# Patient Record
Sex: Female | Born: 1980 | Race: White | Hispanic: Yes | Marital: Married | Smoking: Never smoker
Health system: Southern US, Community
[De-identification: ages and names within clinical notes are randomized; demographics above are authoritative.]

## PROBLEM LIST (undated history)

## (undated) DIAGNOSIS — Z789 Other specified health status: Secondary | ICD-10-CM

## (undated) HISTORY — PX: DILATION AND CURETTAGE OF UTERUS: SHX78

---

## 2003-12-08 ENCOUNTER — Emergency Department (HOSPITAL_COMMUNITY): Admission: EM | Admit: 2003-12-08 | Discharge: 2003-12-08 | Payer: Self-pay | Admitting: Emergency Medicine

## 2004-01-18 ENCOUNTER — Ambulatory Visit: Payer: Self-pay | Admitting: Family Medicine

## 2004-01-22 ENCOUNTER — Ambulatory Visit: Payer: Self-pay | Admitting: Sports Medicine

## 2004-02-04 ENCOUNTER — Encounter (INDEPENDENT_AMBULATORY_CARE_PROVIDER_SITE_OTHER): Payer: Self-pay | Admitting: *Deleted

## 2004-02-04 LAB — CONVERTED CEMR LAB

## 2004-02-13 ENCOUNTER — Ambulatory Visit: Payer: Self-pay | Admitting: Family Medicine

## 2004-03-20 ENCOUNTER — Ambulatory Visit: Payer: Self-pay | Admitting: Sports Medicine

## 2004-03-27 ENCOUNTER — Ambulatory Visit (HOSPITAL_COMMUNITY): Admission: RE | Admit: 2004-03-27 | Discharge: 2004-03-27 | Payer: Self-pay | Admitting: Family Medicine

## 2004-04-18 ENCOUNTER — Ambulatory Visit: Payer: Self-pay | Admitting: Family Medicine

## 2004-05-29 ENCOUNTER — Ambulatory Visit: Payer: Self-pay | Admitting: Family Medicine

## 2004-06-11 ENCOUNTER — Ambulatory Visit: Payer: Self-pay | Admitting: Family Medicine

## 2004-06-28 ENCOUNTER — Ambulatory Visit: Payer: Self-pay | Admitting: Family Medicine

## 2004-07-09 ENCOUNTER — Ambulatory Visit: Payer: Self-pay | Admitting: Family Medicine

## 2004-07-15 ENCOUNTER — Ambulatory Visit: Payer: Self-pay | Admitting: Sports Medicine

## 2004-07-22 ENCOUNTER — Ambulatory Visit: Payer: Self-pay | Admitting: Sports Medicine

## 2004-07-30 ENCOUNTER — Ambulatory Visit: Payer: Self-pay | Admitting: Sports Medicine

## 2004-07-31 ENCOUNTER — Ambulatory Visit: Payer: Self-pay | Admitting: Family Medicine

## 2004-07-31 ENCOUNTER — Inpatient Hospital Stay (HOSPITAL_COMMUNITY): Admission: AD | Admit: 2004-07-31 | Discharge: 2004-08-03 | Payer: Self-pay | Admitting: Family Medicine

## 2004-09-11 ENCOUNTER — Emergency Department (HOSPITAL_COMMUNITY): Admission: EM | Admit: 2004-09-11 | Discharge: 2004-09-11 | Payer: Self-pay | Admitting: Emergency Medicine

## 2004-09-12 ENCOUNTER — Ambulatory Visit: Payer: Self-pay | Admitting: Family Medicine

## 2004-11-01 ENCOUNTER — Ambulatory Visit: Payer: Self-pay | Admitting: Family Medicine

## 2004-12-03 ENCOUNTER — Ambulatory Visit: Payer: Self-pay | Admitting: Family Medicine

## 2005-01-02 ENCOUNTER — Ambulatory Visit: Payer: Self-pay | Admitting: Sports Medicine

## 2005-01-31 ENCOUNTER — Ambulatory Visit: Payer: Self-pay | Admitting: Family Medicine

## 2005-03-26 ENCOUNTER — Ambulatory Visit: Payer: Self-pay | Admitting: Sports Medicine

## 2005-04-09 ENCOUNTER — Ambulatory Visit: Payer: Self-pay | Admitting: Family Medicine

## 2006-04-03 ENCOUNTER — Encounter (INDEPENDENT_AMBULATORY_CARE_PROVIDER_SITE_OTHER): Payer: Self-pay | Admitting: *Deleted

## 2006-10-01 ENCOUNTER — Inpatient Hospital Stay (HOSPITAL_COMMUNITY): Admission: AD | Admit: 2006-10-01 | Discharge: 2006-10-01 | Payer: Self-pay | Admitting: Obstetrics and Gynecology

## 2006-10-16 ENCOUNTER — Inpatient Hospital Stay (HOSPITAL_COMMUNITY): Admission: AD | Admit: 2006-10-16 | Discharge: 2006-10-16 | Payer: Self-pay | Admitting: Obstetrics & Gynecology

## 2006-10-18 ENCOUNTER — Ambulatory Visit (HOSPITAL_COMMUNITY): Admission: AD | Admit: 2006-10-18 | Discharge: 2006-10-18 | Payer: Self-pay | Admitting: Gynecology

## 2006-10-18 ENCOUNTER — Encounter (INDEPENDENT_AMBULATORY_CARE_PROVIDER_SITE_OTHER): Payer: Self-pay | Admitting: Gynecology

## 2006-10-18 ENCOUNTER — Ambulatory Visit: Payer: Self-pay | Admitting: Gynecology

## 2007-09-08 ENCOUNTER — Ambulatory Visit: Payer: Self-pay | Admitting: Family Medicine

## 2007-09-23 ENCOUNTER — Ambulatory Visit: Payer: Self-pay | Admitting: Internal Medicine

## 2007-09-23 ENCOUNTER — Encounter: Payer: Self-pay | Admitting: Family Medicine

## 2007-09-23 LAB — CONVERTED CEMR LAB
ALT: 14 units/L (ref 0–35)
AST: 15 units/L (ref 0–37)
Albumin: 4.8 g/dL (ref 3.5–5.2)
Alkaline Phosphatase: 48 units/L (ref 39–117)
BUN: 11 mg/dL (ref 6–23)
Basophils Absolute: 0 10*3/uL (ref 0.0–0.1)
Basophils Relative: 0 % (ref 0–1)
CO2: 23 meq/L (ref 19–32)
Calcium: 9.4 mg/dL (ref 8.4–10.5)
Chloride: 103 meq/L (ref 96–112)
Cholesterol: 174 mg/dL (ref 0–200)
Creatinine, Ser: 0.6 mg/dL (ref 0.40–1.20)
Eosinophils Absolute: 0.1 10*3/uL (ref 0.0–0.7)
Eosinophils Relative: 2 % (ref 0–5)
Glucose, Bld: 91 mg/dL (ref 70–99)
HCT: 42.2 % (ref 36.0–46.0)
HDL: 39 mg/dL — ABNORMAL LOW (ref >39)
Hemoglobin: 14 g/dL (ref 12.0–15.0)
LDL Cholesterol: 108 mg/dL — ABNORMAL HIGH (ref 0–99)
Lymphocytes Relative: 40 % (ref 12–46)
Lymphs Abs: 2.4 10*3/uL (ref 0.7–4.0)
MCHC: 33.2 g/dL (ref 30.0–36.0)
MCV: 89.2 fL (ref 78.0–100.0)
Monocytes Absolute: 0.5 10*3/uL (ref 0.1–1.0)
Monocytes Relative: 8 % (ref 3–12)
Neutro Abs: 3 10*3/uL (ref 1.7–7.7)
Neutrophils Relative %: 50 % (ref 43–77)
Platelets: 238 10*3/uL (ref 150–400)
Potassium: 4.3 meq/L (ref 3.5–5.3)
RBC: 4.73 M/uL (ref 3.87–5.11)
RDW: 13.2 % (ref 11.5–15.5)
Sed Rate: 4 mm/hr (ref 0–22)
Sodium: 138 meq/L (ref 135–145)
TSH: 1.322 microintl units/mL (ref 0.350–4.50)
Total Bilirubin: 0.5 mg/dL (ref 0.3–1.2)
Total CHOL/HDL Ratio: 4.5
Total Protein: 7.2 g/dL (ref 6.0–8.3)
Triglycerides: 136 mg/dL (ref <150)
VLDL: 27 mg/dL (ref 0–40)
WBC: 6.1 10*3/uL (ref 4.0–10.5)

## 2007-11-11 ENCOUNTER — Ambulatory Visit: Payer: Self-pay | Admitting: Internal Medicine

## 2007-11-12 ENCOUNTER — Ambulatory Visit: Payer: Self-pay | Admitting: Internal Medicine

## 2007-11-12 ENCOUNTER — Encounter: Payer: Self-pay | Admitting: Family Medicine

## 2007-11-12 LAB — CONVERTED CEMR LAB
Chlamydia, DNA Probe: NEGATIVE
GC Probe Amp, Genital: NEGATIVE

## 2008-03-01 ENCOUNTER — Ambulatory Visit: Payer: Self-pay | Admitting: Internal Medicine

## 2008-04-03 ENCOUNTER — Ambulatory Visit: Payer: Self-pay | Admitting: Family Medicine

## 2008-04-14 ENCOUNTER — Inpatient Hospital Stay (HOSPITAL_COMMUNITY): Admission: AD | Admit: 2008-04-14 | Discharge: 2008-04-14 | Payer: Self-pay | Admitting: Obstetrics & Gynecology

## 2008-07-10 ENCOUNTER — Ambulatory Visit (HOSPITAL_COMMUNITY): Admission: RE | Admit: 2008-07-10 | Discharge: 2008-07-10 | Payer: Self-pay | Admitting: Family Medicine

## 2008-11-13 ENCOUNTER — Ambulatory Visit: Payer: Self-pay | Admitting: Family Medicine

## 2008-11-13 ENCOUNTER — Inpatient Hospital Stay (HOSPITAL_COMMUNITY): Admission: AD | Admit: 2008-11-13 | Discharge: 2008-11-15 | Payer: Self-pay | Admitting: Obstetrics & Gynecology

## 2009-11-21 ENCOUNTER — Ambulatory Visit (HOSPITAL_COMMUNITY): Admission: RE | Admit: 2009-11-21 | Discharge: 2009-11-21 | Payer: Self-pay | Admitting: Family Medicine

## 2010-03-17 ENCOUNTER — Inpatient Hospital Stay (HOSPITAL_COMMUNITY)
Admission: AD | Admit: 2010-03-17 | Discharge: 2010-03-19 | DRG: 775 | Disposition: A | Discharge status: Home / Self Care | Payer: Medicaid Other | Source: Ambulatory Visit | Attending: Family Medicine | Admitting: Family Medicine

## 2010-03-17 DIAGNOSIS — O99892 Other specified diseases and conditions complicating childbirth: Principal | ICD-10-CM | POA: Diagnosis present

## 2010-03-17 DIAGNOSIS — Z22330 Carrier of Group B streptococcus: Secondary | ICD-10-CM

## 2010-03-17 DIAGNOSIS — O9989 Other specified diseases and conditions complicating pregnancy, childbirth and the puerperium: Secondary | ICD-10-CM

## 2010-03-17 LAB — CBC
HCT: 37.8 % (ref 36.0–46.0)
Hemoglobin: 12.7 g/dL (ref 12.0–15.0)
MCH: 29.6 pg (ref 26.0–34.0)
MCHC: 33.6 g/dL (ref 30.0–36.0)
MCV: 88.1 fL (ref 78.0–100.0)
Platelets: 181 10*3/uL (ref 150–400)
RBC: 4.29 MIL/uL (ref 3.87–5.11)
RDW: 14.6 % (ref 11.5–15.5)
WBC: 9.7 10*3/uL (ref 4.0–10.5)

## 2010-03-17 LAB — RPR: RPR Ser Ql: NONREACTIVE

## 2010-04-08 IMAGING — US US OB COMP LESS 14 WK
1 series · 14 of 21 positions shown · non-contrast
Comparison: None

CLINICAL DATA: Pelvic pain.  7-week-3-day gestational age by LMP.

OBSTETRIC <14 WK ULTRASOUND
TECHNIQUE: Transabdominal ultrasound was performed for evaluation
of the gestation as well as the maternal uterus and adnexal
regions.

[Series 1: us ob comp less 14 wks · 21 acquisitions, 14 frames shown]
[im 1/21]
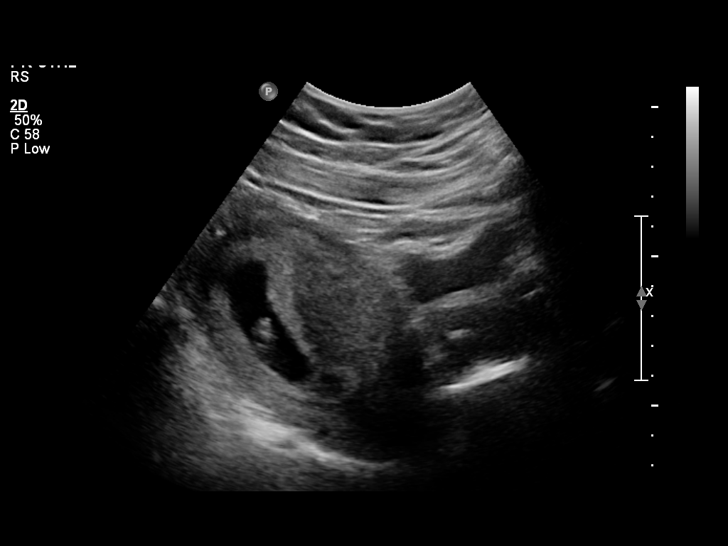
[im 3/21]
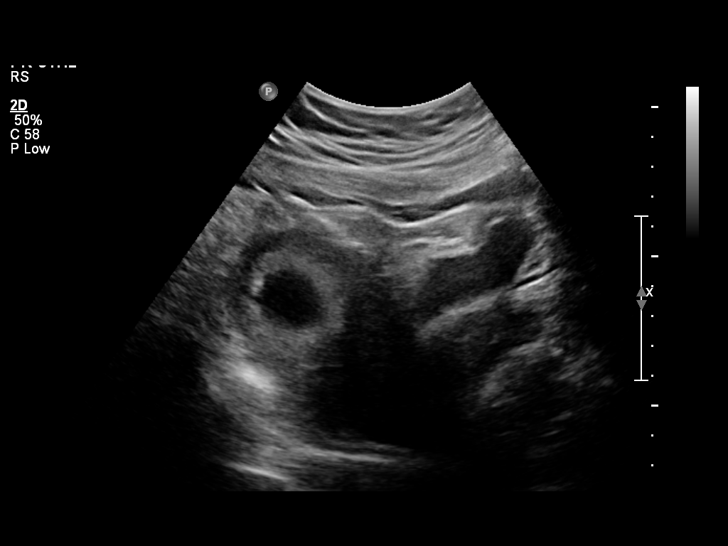
[im 4/21]
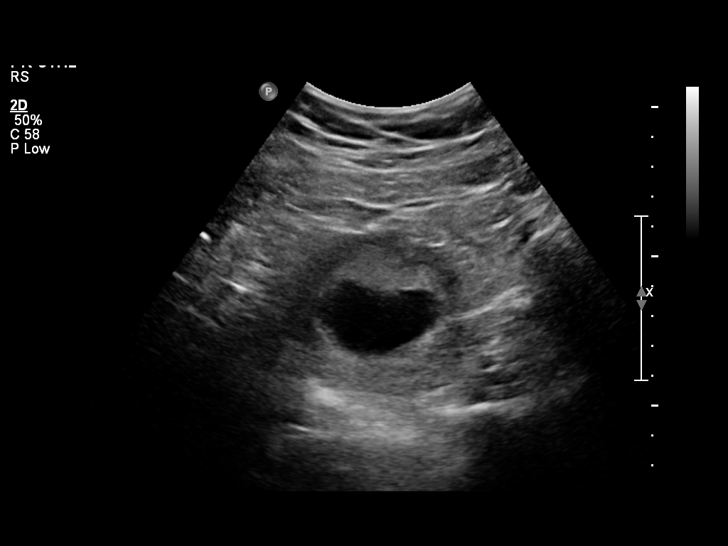
[im 6/21]
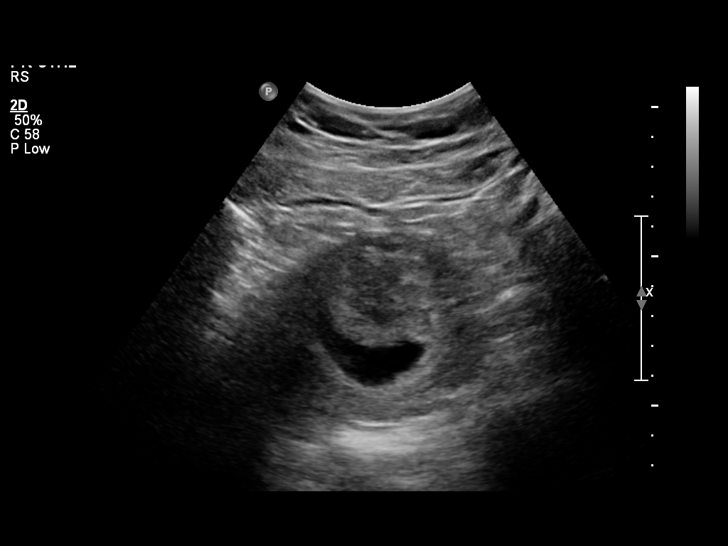
[im 7/21]
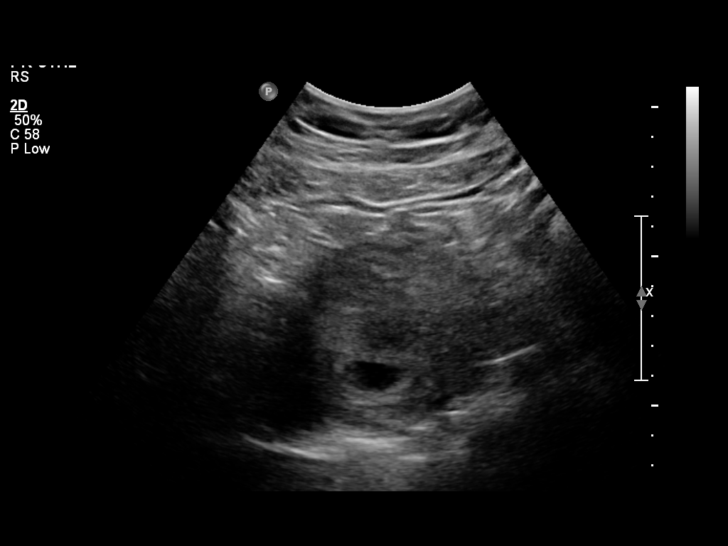
[im 9/21]
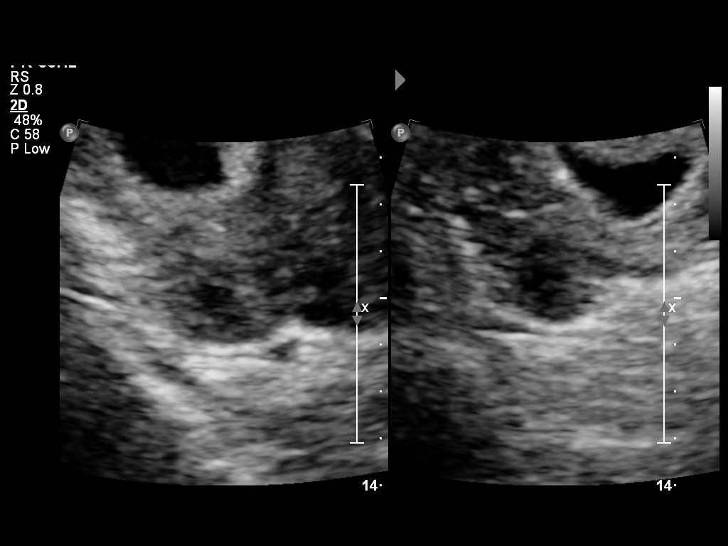
[im 10/21]
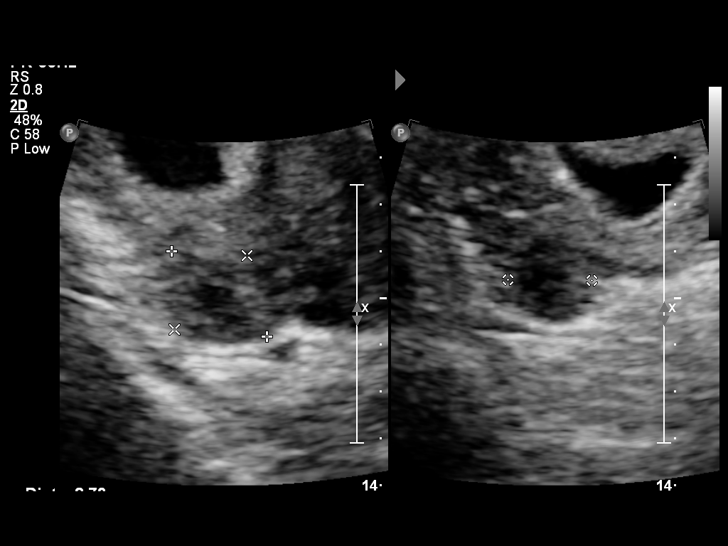
[im 12/21]
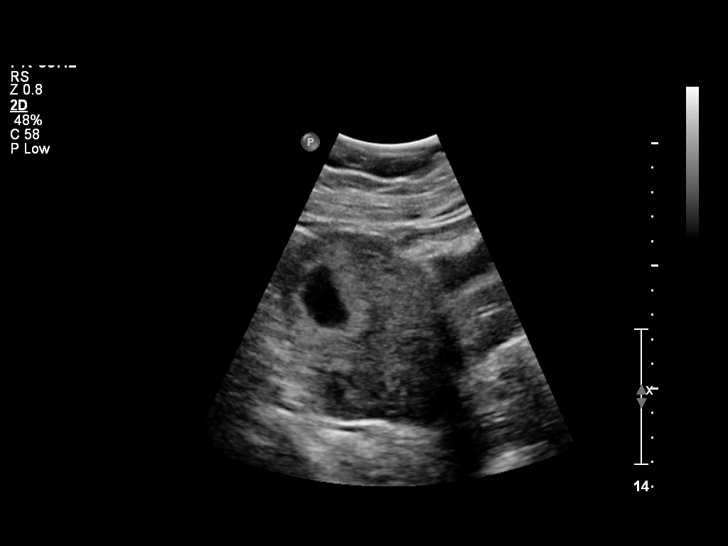
[im 13/21]
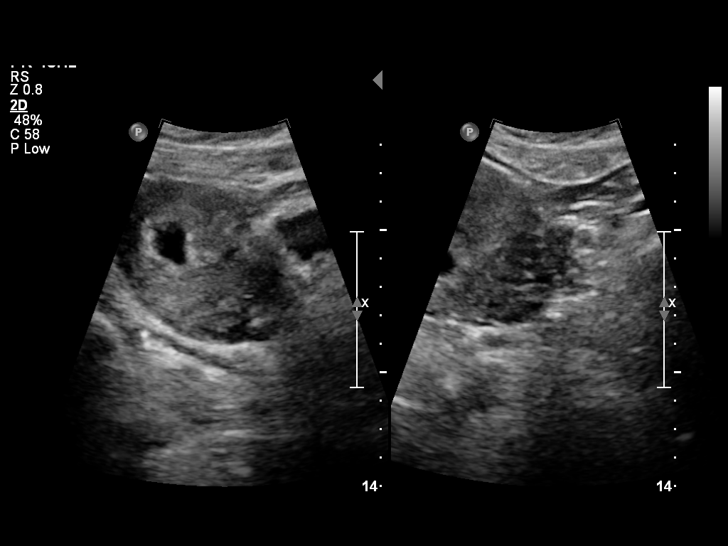
[im 15/21]
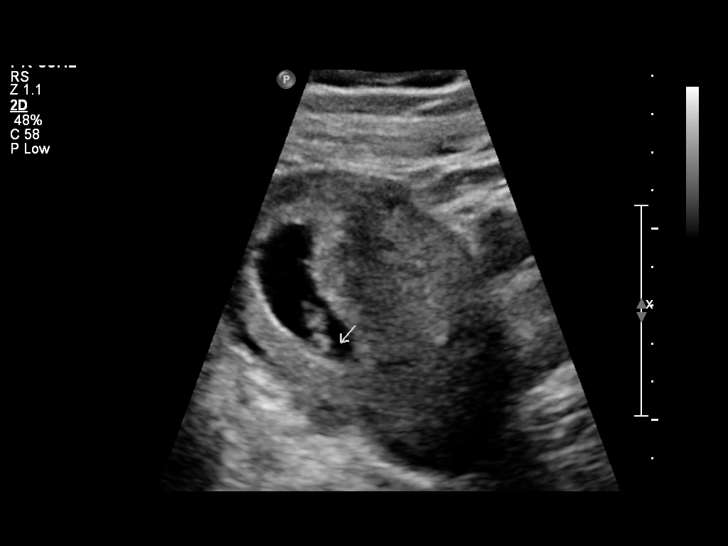
[im 16/21]
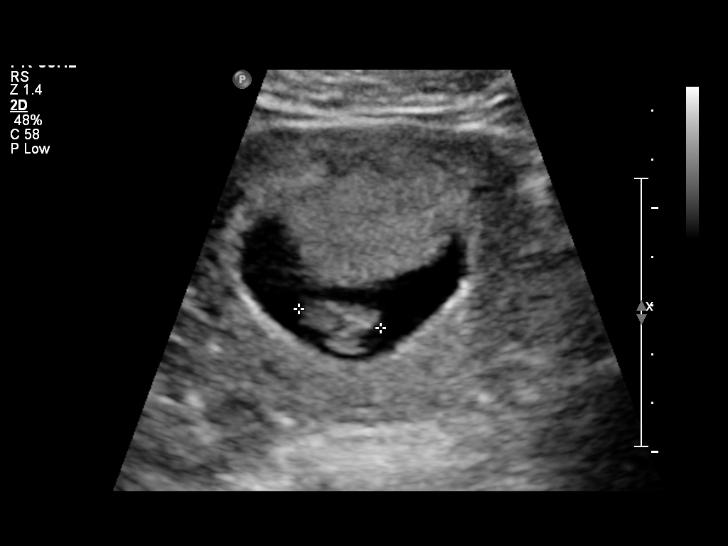
[im 18/21]
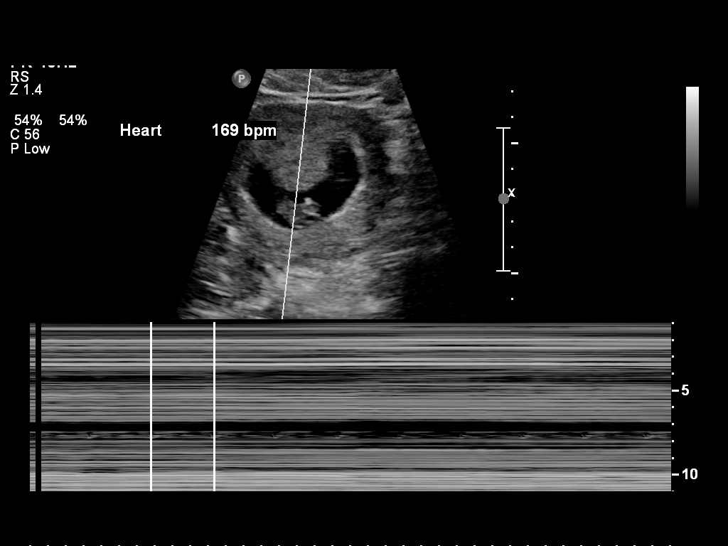
[im 19/21]
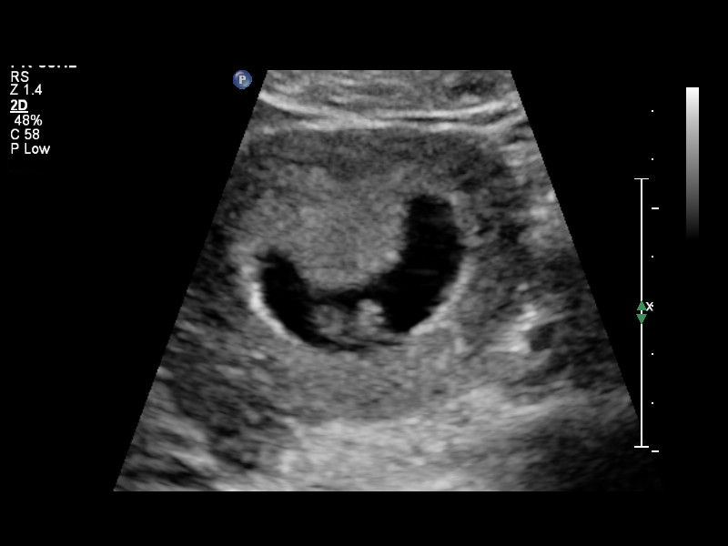
[im 21/21]
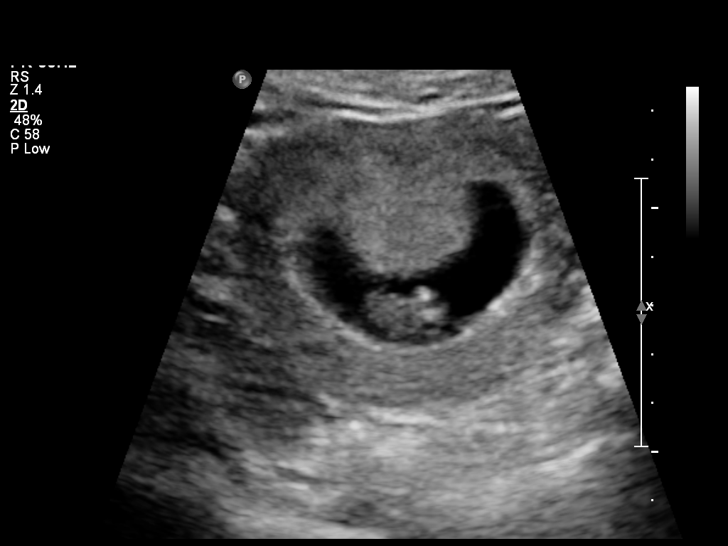

[14 of 21 positions shown; findings below may reference images not displayed]

Intrauterine gestational sac: Single
Yolk sac: Visualized
Embryo: Visualized
Cardiac Activity: Visualized
Heart Rate: 169 bpm

CRL:  17 mm         8w  2d          US EDC: 11/22/2008

Maternal uterus/Adnexae:
No abnormality identified.  Normal ovaries.
IMPRESSION: 1.  Single living IUP.  Ultrasound dating is concordant with LMP.
2.  No maternal uterine or adnexal abnormality identified.

## 2010-05-09 LAB — URINALYSIS, ROUTINE W REFLEX MICROSCOPIC
Bilirubin Urine: NEGATIVE
Glucose, UA: NEGATIVE mg/dL
Hgb urine dipstick: NEGATIVE
Ketones, ur: NEGATIVE mg/dL
Nitrite: NEGATIVE
Protein, ur: NEGATIVE mg/dL
Specific Gravity, Urine: 1.01 (ref 1.005–1.030)
Urobilinogen, UA: 0.2 mg/dL (ref 0.0–1.0)
pH: 7.5 (ref 5.0–8.0)

## 2010-05-09 LAB — COMPREHENSIVE METABOLIC PANEL WITH GFR
ALT: 18 U/L (ref 0–35)
AST: 31 U/L (ref 0–37)
Albumin: 3.3 g/dL — ABNORMAL LOW (ref 3.5–5.2)
Alkaline Phosphatase: 213 U/L — ABNORMAL HIGH (ref 39–117)
BUN: 6 mg/dL (ref 6–23)
CO2: 21 meq/L (ref 19–32)
Calcium: 9.4 mg/dL (ref 8.4–10.5)
Chloride: 104 meq/L (ref 96–112)
Creatinine, Ser: 0.5 mg/dL (ref 0.4–1.2)
GFR calc Af Amer: >60 mL/min (ref >60)
GFR calc non Af Amer: >60 mL/min (ref >60)
Glucose, Bld: 83 mg/dL (ref 70–99)
Potassium: 3.9 meq/L (ref 3.5–5.1)
Sodium: 134 meq/L — ABNORMAL LOW (ref 135–145)
Total Bilirubin: 0.4 mg/dL (ref 0.3–1.2)
Total Protein: 6.3 g/dL (ref 6.0–8.3)

## 2010-05-09 LAB — CBC
HCT: 37.5 % (ref 36.0–46.0)
Hemoglobin: 12.6 g/dL (ref 12.0–15.0)
MCHC: 33.5 g/dL (ref 30.0–36.0)
MCV: 90.3 fL (ref 78.0–100.0)
Platelets: 188 10*3/uL (ref 150–400)
RBC: 4.16 MIL/uL (ref 3.87–5.11)
RDW: 15.6 % — ABNORMAL HIGH (ref 11.5–15.5)
WBC: 10.7 10*3/uL — ABNORMAL HIGH (ref 4.0–10.5)

## 2010-05-09 LAB — RPR: RPR Ser Ql: NONREACTIVE

## 2010-05-16 LAB — URINALYSIS, ROUTINE W REFLEX MICROSCOPIC
Bilirubin Urine: NEGATIVE
Glucose, UA: NEGATIVE mg/dL
Hgb urine dipstick: NEGATIVE
Ketones, ur: NEGATIVE mg/dL
Nitrite: NEGATIVE
Protein, ur: NEGATIVE mg/dL
Specific Gravity, Urine: 1.025 (ref 1.005–1.030)
Urobilinogen, UA: 0.2 mg/dL (ref 0.0–1.0)
pH: 6 (ref 5.0–8.0)

## 2010-05-16 LAB — CBC
HCT: 38.9 % (ref 36.0–46.0)
Hemoglobin: 13.4 g/dL (ref 12.0–15.0)
MCHC: 34.4 g/dL (ref 30.0–36.0)
MCV: 92.3 fL (ref 78.0–100.0)
Platelets: 213 10*3/uL (ref 150–400)
RBC: 4.21 MIL/uL (ref 3.87–5.11)
RDW: 12.8 % (ref 11.5–15.5)
WBC: 11.9 10*3/uL — ABNORMAL HIGH (ref 4.0–10.5)

## 2010-05-16 LAB — HCG, QUANTITATIVE, PREGNANCY: hCG, Beta Chain, Quant, S: 153186 m[IU]/mL — ABNORMAL HIGH (ref <5)

## 2010-05-16 LAB — WET PREP, GENITAL
Clue Cells Wet Prep HPF POC: NONE SEEN
Trich, Wet Prep: NONE SEEN
Yeast Wet Prep HPF POC: NONE SEEN

## 2010-05-16 LAB — POCT PREGNANCY, URINE: Preg Test, Ur: POSITIVE

## 2010-05-16 LAB — GC/CHLAMYDIA PROBE AMP, GENITAL
Chlamydia, DNA Probe: NEGATIVE
GC Probe Amp, Genital: NEGATIVE

## 2010-06-18 NOTE — Op Note (Signed)
Brenda Snow, DIGUGLIELMO ACCOUNT NO.:  [id number]   MEDICAL RECORD NO.:  [id number]          PATIENT TYPE:  MAT   LOCATION:  [facility]                          FACILITY:  [facility]   PHYSICIAN:  Ginger Carne, MD  DATE OF BIRTH:  [birth year]   DATE OF PROCEDURE:  10/18/2006  DATE OF DISCHARGE:                               OPERATIVE REPORT   PREOPERATIVE DIAGNOSIS:  First trimester incomplete abortion.   POSTOPERATIVE DIAGNOSIS:  First trimester incomplete abortion.   PROCEDURE:  Aspiration, dilation and extraction.   SURGEON:  Ginger Carne, MD   ASSISTANT:  None.   COMPLICATIONS:  None immediate.   ESTIMATED BLOOD LOSS:  Minimal.   SPECIMEN:  Products of conception to Pathology.   OPERATIVE FINDINGS:  External genitalia, vulva and vagina normal.  Cervix smooth, without erosions or lesions.  Uterus was 9-10 weeks in  size, sounded to 10 cm, products of conception noted.  Both adnexa  palpable and found to be normal.  She is Rh-positive.   OPERATIVE PROCEDURE:  The patient was prepped and draped in the usual  fashion and placed in the lithotomy position, Betadine solution used for  antiseptic and the patient was catheterized prior to the procedure.  After adequate general anesthesia, a tenaculum was placed on the  anterior lip of the cervix.  Dilatation to accommodate a #10 suction  curette was then followed by sharp and then suction curettage.  No  material was left inside the endometrial cavity, minimal bleeding noted.  The patient tolerated the procedure well and returned to the post  anesthesia recovery room in excellent condition.      Ginger Carne, MD  Electronically Signed     SHB/MEDQ  D:  10/18/2006  T:  10/19/2006  Job:  [id number]

## 2010-11-15 LAB — URINALYSIS, ROUTINE W REFLEX MICROSCOPIC
Bilirubin Urine: NEGATIVE
Glucose, UA: NEGATIVE
Ketones, ur: NEGATIVE
Leukocytes, UA: NEGATIVE
Nitrite: NEGATIVE
Protein, ur: NEGATIVE
Specific Gravity, Urine: <1.005 — ABNORMAL LOW
Urobilinogen, UA: 0.2
pH: 6.5

## 2010-11-15 LAB — URINE MICROSCOPIC-ADD ON

## 2010-11-15 LAB — CBC
Hemoglobin: 13.3
MCHC: 35.1
MCV: 88.9
Platelets: 268
RBC: 4.26
RDW: 13
WBC: 10.7 — ABNORMAL HIGH
WBC: 10.9 — ABNORMAL HIGH

## 2010-11-15 LAB — POCT PREGNANCY, URINE
Operator id: 120561
Operator id: 114931
Preg Test, Ur: POSITIVE
Preg Test, Ur: POSITIVE

## 2010-11-15 LAB — RPR: RPR Ser Ql: NONREACTIVE

## 2010-11-15 LAB — HCG, QUANTITATIVE, PREGNANCY: hCG, Beta Chain, Quant, S: 1524 — ABNORMAL HIGH

## 2010-11-15 LAB — WET PREP, GENITAL: Trich, Wet Prep: NONE SEEN

## 2010-11-15 LAB — ABO/RH: ABO/RH(D): A POS

## 2010-11-15 LAB — GC/CHLAMYDIA PROBE AMP, GENITAL: GC Probe Amp, Genital: NEGATIVE

## 2011-06-03 ENCOUNTER — Encounter: Payer: Self-pay | Admitting: Family Medicine

## 2011-06-03 ENCOUNTER — Ambulatory Visit: Payer: Self-pay | Admitting: Family Medicine

## 2011-06-03 DIAGNOSIS — R112 Nausea with vomiting, unspecified: Secondary | ICD-10-CM

## 2011-06-03 DIAGNOSIS — R1084 Generalized abdominal pain: Secondary | ICD-10-CM

## 2011-06-03 DIAGNOSIS — R5381 Other malaise: Secondary | ICD-10-CM

## 2011-06-03 DIAGNOSIS — R002 Palpitations: Secondary | ICD-10-CM

## 2011-06-03 DIAGNOSIS — R110 Nausea: Secondary | ICD-10-CM

## 2011-06-03 DIAGNOSIS — R5383 Other fatigue: Secondary | ICD-10-CM

## 2011-06-03 LAB — COMPREHENSIVE METABOLIC PANEL
ALT: 14 U/L (ref 0–35)
AST: 13 U/L (ref 0–37)
Albumin: 5.2 g/dL (ref 3.5–5.2)
Alkaline Phosphatase: 46 U/L (ref 39–117)
BUN: 8 mg/dL (ref 6–23)
CO2: 26 mEq/L (ref 19–32)
Calcium: 10.5 mg/dL (ref 8.4–10.5)
Chloride: 104 mEq/L (ref 96–112)
Creat: 0.62 mg/dL (ref 0.50–1.10)
Glucose, Bld: 93 mg/dL (ref 70–99)
Potassium: 4 mEq/L (ref 3.5–5.3)
Sodium: 141 mEq/L (ref 135–145)
Total Bilirubin: 0.3 mg/dL (ref 0.3–1.2)
Total Protein: 7.9 g/dL (ref 6.0–8.3)

## 2011-06-03 LAB — POCT CBC
Granulocyte percent: 68.6 %G (ref 37–80)
HCT, POC: 45.4 % (ref 37.7–47.9)
Hemoglobin: 14.7 g/dL (ref 12.2–16.2)
Lymph, poc: 2.5 (ref 0.6–3.4)
MCH, POC: 29.5 pg (ref 27–31.2)
MCHC: 32.4 g/dL (ref 31.8–35.4)
MCV: 91.2 fL (ref 80–97)
MID (cbc): 0.5 (ref 0–0.9)
MPV: 11.6 fL (ref 0–99.8)
POC Granulocyte: 6.7 (ref 2–6.9)
POC LYMPH PERCENT: 25.8 %L (ref 10–50)
POC MID %: 5.6 %M (ref 0–12)
Platelet Count, POC: 272 10*3/uL (ref 142–424)
RBC: 4.98 M/uL (ref 4.04–5.48)
RDW, POC: 13.6 %
WBC: 9.7 10*3/uL (ref 4.6–10.2)

## 2011-06-03 LAB — POCT URINALYSIS DIPSTICK
Bilirubin, UA: NEGATIVE
Blood, UA: NEGATIVE
Glucose, UA: NEGATIVE
Ketones, UA: NEGATIVE
Leukocytes, UA: NEGATIVE
Nitrite, UA: NEGATIVE
Protein, UA: NEGATIVE
Spec Grav, UA: 1.02
Urobilinogen, UA: 0.2
pH, UA: 7

## 2011-06-03 LAB — POCT UA - MICROSCOPIC ONLY
Bacteria, U Microscopic: NEGATIVE
Casts, Ur, LPF, POC: NEGATIVE
Crystals, Ur, HPF, POC: NEGATIVE
Mucus, UA: NEGATIVE
RBC, urine, microscopic: NEGATIVE
WBC, Ur, HPF, POC: NEGATIVE
Yeast, UA: NEGATIVE

## 2011-06-03 LAB — T4, FREE: Free T4: 1.15 ng/dL (ref 0.80–1.80)

## 2011-06-03 LAB — POCT URINE PREGNANCY: Preg Test, Ur: NEGATIVE

## 2011-06-03 MED ORDER — PROMETHAZINE HCL 12.5 MG PO TABS: 12.5000 mg | ORAL_TABLET | Freq: Three times a day (TID) | ORAL | Status: DC | PRN

## 2011-06-03 NOTE — Progress Notes (Signed)
This is a 31 year old Hispanic woman who comes in with a friend complaining of palpitations and nausea for one week. She's not had these problems before. She doubts she is pregnant because she's using precautions. She has no cramps, no vomiting, no diarrhea, no rash, no chest pains or shortness of breath.  Objective: Patient appears a little anxious but in no acute distress. Breathing appears normal and patient is cooperative and friendly  HEENT: Unremarkable  Neck: No adenopathy or thyromegaly  Chest: Clear  Heart: Regular with 2/6 systolic murmur best heard at the right heart border radiating up to the right shoulder, no gallop  Abdomen: Normal bowel sounds, no HSM, no masses, nontender  Extremities: Moving 4 extremities equally without any apparent bony or soft tissue abnormalities  Skin: No rash  EKG:  NSR  Results for orders placed in visit on 06/03/11  POCT CBC      Component Value Range   WBC 9.7  4.6 - 10.2 (K/uL)   Lymph, poc 2.5  0.6 - 3.4    POC LYMPH PERCENT 25.8  10 - 50 (%L)   MID (cbc) 0.5  0 - 0.9    POC MID % 5.6  0 - 12 (%M)   POC Granulocyte 6.7  2 - 6.9    Granulocyte percent 68.6  37 - 80 (%G)   RBC 4.98  4.04 - 5.48 (M/uL)   Hemoglobin 14.7  12.2 - 16.2 (g/dL)   HCT, POC 57.8  46.9 - 47.9 (%)   MCV 91.2  80 - 97 (fL)   MCH, POC 29.5  27 - 31.2 (pg)   MCHC 32.4  31.8 - 35.4 (g/dL)   RDW, POC 62.9     Platelet Count, POC 272  142 - 424 (K/uL)   MPV 11.6  0 - 99.8 (fL)  POCT UA - MICROSCOPIC ONLY      Component Value Range   WBC, Ur, HPF, POC neg     RBC, urine, microscopic neg     Bacteria, U Microscopic neg     Mucus, UA neg     Epithelial cells, urine per micros 0-1     Crystals, Ur, HPF, POC neg     Casts, Ur, LPF, POC neg     Yeast, UA neg    POCT URINALYSIS DIPSTICK      Component Value Range   Color, UA light yellow     Clarity, UA clear     Glucose, UA neg     Bilirubin, UA neg     Ketones, UA neg     Spec Grav, UA 1.020     Blood,  UA neg     pH, UA 7.0     Protein, UA neg     Urobilinogen, UA 0.2     Nitrite, UA neg     Leukocytes, UA Negative     Negative pregnancy test  Assessment:   31 yo woman with recent palpitations, nausea which are bothersome but which have no significant associated signs or lab values.  Plan:  Phenergan 25 mg at hs Thyroid pending  FriendLamar Laundry:  [phone]

## 2011-06-03 NOTE — Patient Instructions (Signed)
Nuseas, Adulto (Nausea, Adult) La nusea es la sensacin de Dentist en el estmago o de la necesidad de vomitar. No constituye una preocupacin seria en s misma, pero puede ser un signo de problemas mdicos ms graves. Si empeora, puede provocar vmitos. Si aparecen vmitos, hay riesgo de deshidratacin.  CAUSES   Infecciones por virus.   Intoxicacin alimentaria.   Medicamentos.   Embarazo.   Mareos por movimiento.   Cefaleas migraosas.   Estrs emocional.   Dolor intenso producido en Corporate treasurer.   Intoxicacin por alcohol.  INSTRUCCIONES PARA EL CUIDADO EN EL HOGAR   Debe hacer reposo.   Pida instrucciones especficas a su mdico con respecto a la rehidratacin.   Consuma cantidades pequeas de alimentos y tome sorbos de lquidos con ms frecuencia.   [employer] como le indic el mdico.  SOLICITE ATENCIN MDICA SI:   No mejora, o [place], despus de 2 [address].   Tiene cefalea.  SOLICITE ATENCIN MDICA DE INMEDIATO SI:  Tiene fiebre.   Se desmaya.   Sigue vomitando u observa sangre en el vmito.   Se siente extremadamente dbil o deshidratado.   La materia fecal es negra o tiene [place].   Siente dolor intenso en el pecho o en el abdomen.  ASEGRESE DE QUE:   Comprende estas instrucciones.   Controlar su enfermedad.   Solicitar ayuda de inmediato si no mejora o si empeora.  Document Released: 01/20/2005 Document Revised: 01/09/2011 [facility] Patient Information 2012 [place], [place].

## 2011-06-04 ENCOUNTER — Telehealth: Payer: Self-pay

## 2011-06-04 NOTE — Telephone Encounter (Signed)
.  umfc The patient's friend called regarding lab results from patient's visit on 06/03/11.   Please call the PATIENT to verify request and let her know about results once they come in.  Patient's phone number is [phone]

## 2011-06-05 NOTE — Telephone Encounter (Signed)
Letter was sent yesterday

## 2011-06-05 NOTE — Telephone Encounter (Signed)
CALLED PT TO GIVE HER LAB RESULTS. SHE STATED SHE DID NOT SPEAK ENGLISH AND THEN HUNG UP ON ME.

## 2011-06-07 ENCOUNTER — Encounter: Payer: Self-pay | Admitting: Family Medicine

## 2011-06-07 ENCOUNTER — Ambulatory Visit: Payer: Self-pay | Admitting: Family Medicine

## 2011-06-07 VITALS — BP 145/82 | HR 74 | Temp 98.5°F | Resp 16 | Ht 60.5 in | Wt 140.6 lb

## 2011-06-07 DIAGNOSIS — R002 Palpitations: Secondary | ICD-10-CM

## 2011-06-07 LAB — GLUCOSE, POCT (MANUAL RESULT ENTRY): POC Glucose: 88

## 2011-06-07 MED ORDER — DILTIAZEM HCL 30 MG PO TABS: 30.0000 mg | ORAL_TABLET | Freq: Four times a day (QID) | ORAL | Status: DC

## 2011-06-07 NOTE — Progress Notes (Signed)
This is a followup visit for this mother of 3 who is been having palpitations as frequently at time of sleep. Been under stress with her children and has no other symptoms of heart disease such as shortness of breath, chest pain, diaphoresis, cough.    O:  120/80 right arm sitting Chest is clear  Heart: Regular no murmur or gallop  Spent 20 minutes trying to explain her palpitations to the patient and her friend Furniture conservator/restorer. They have elected to go ahead and get a cholesterol check.  Assessment: Palpitations, most likely related to anxiety  Plan trial of diltiazem 30 mg at at bedtime and we will check the cholesterol

## 2011-06-07 NOTE — Patient Instructions (Signed)
Palpitaciones  (Palpitations) Usted tiene palpitaciones. Es la sensacin de sentir que el latido cardaco es irregular o es ms rpido que lo normal. Aunque es algo que atemoriza, generalmente no es grave. Algunas de las causas de este trastorno son el fumar en exceso, en el consumo de cafena o de alcohol. Tambin pueden originarse por situaciones de estrs o de ansiedad. A veces la causa es una enfermedad cardaca. A menos que lo confirme de otro modo, el profesional que lo asiste no ha encontrado ahora signos de enfermedad grave. INSTRUCCIONES PARA EL CUIDADO DOMICILIARIO Para prevenir las palpitaciones:  Leamon Arnt, te y gaseosas sin cafena. Evite el chocolate.   Si fuma o bebe alcohol, deje el hbito o redzcalo lo ms que pueda.   Reduzca los niveles de estrs y [place]. La biorretroalimentacin, el yoga o la meditacin lo ayudarn a relajarse. Tambin podrn ayudarlo la natacin, trotar o caminar.  SOLICITE ATENCIN MDICA SI:  Sigue sintiendo el ritmo cardaco acelerado.   Las [employer] suceden con ms frecuencia.  SOLICITE ATENCIN MDICA DE INMEDIATO SI: Presenta dolor en el pecho, falta de aire, dolor de cabeza intenso, mareos o se desmaya. Document Released: 10/30/2004 Document Revised: 01/09/2011 [facility] Patient Information 2012 [place], [place].Palpitations  A palpitation is the feeling that your heartbeat is irregular or is faster than normal. Although this is frightening, it usually is not serious. Palpitations may be caused by excesses of smoking, caffeine, or alcohol. They are also brought on by stress and anxiety. Sometimes, they are caused by heart disease. Unless otherwise noted, your caregiver did not find any signs of serious illness at this time. HOME CARE INSTRUCTIONS  To help prevent palpitations:  Drink decaffeinated coffee, tea, and soda pop. Avoid chocolate.   If you smoke or drink alcohol, quit or cut down as much as possible.   Reduce your stress or  anxiety level. Biofeedback, yoga, or meditation will help you relax. Physical activity such as swimming, jogging, or walking also may be helpful.  SEEK MEDICAL CARE IF:   You continue to have a fast heartbeat.   Your palpitations occur more often.  SEEK IMMEDIATE MEDICAL CARE IF: You develop chest pain, shortness of breath, severe headache, dizziness, or fainting. Document Released: 01/18/2000 Document Revised: 01/09/2011 Document Reviewed: 03/19/2007 [facility] Patient Information 2012 [place], [place].

## 2011-06-08 LAB — LIPID PANEL
Cholesterol: 189 mg/dL (ref 0–200)
HDL: 50 mg/dL (ref >39)
LDL Cholesterol: 118 mg/dL — ABNORMAL HIGH (ref 0–99)
Total CHOL/HDL Ratio: 3.8 Ratio
Triglycerides: 103 mg/dL (ref <150)
VLDL: 21 mg/dL (ref 0–40)

## 2012-02-04 NOTE — L&D Delivery Note (Signed)
Delivery Note At 3:15 PM a healthy female was delivered via Vaginal, Spontaneous Delivery (Presentation: ; Occiput Anterior).  APGAR: 9, 9; weight .   Placenta status: Intact, Spontaneous.  Cord: 3 vessels with the following complications: None.    Anesthesia: None  Episiotomy: None Lacerations: 1st degree; Periurethral;Perineal Suture Repair: none Est. Blood Loss (mL): 200  Mom to postpartum.  Baby to Nursery.  Mat Carne, MD  12/13/2012, 3:35 PM   I was present for and supervised the delivery of this baby.  I agree with the documentation as above. Hemostatic periurethral tear not requiring suturing.    Fenix Ruppe, Redmond Baseman, MD

## 2012-06-03 ENCOUNTER — Other Ambulatory Visit: Payer: Self-pay

## 2012-06-03 DIAGNOSIS — Z331 Pregnant state, incidental: Secondary | ICD-10-CM

## 2012-06-03 NOTE — Progress Notes (Unsigned)
PRENATAL LABS DONE TODAY Terrall Bley 

## 2012-06-04 LAB — SICKLE CELL SCREEN: Sickle Cell Screen: NEGATIVE

## 2012-06-04 LAB — OBSTETRIC PANEL
Antibody Screen: NEGATIVE
Basophils Relative: 0 % (ref 0–1)
Eosinophils Absolute: 0.5 10*3/uL (ref 0.0–0.7)
Hemoglobin: 12.4 g/dL (ref 12.0–15.0)
Hepatitis B Surface Ag: NEGATIVE
MCH: 30 pg (ref 26.0–34.0)
MCHC: 34.3 g/dL (ref 30.0–36.0)
Monocytes Relative: 6 % (ref 3–12)
Neutrophils Relative %: 65 % (ref 43–77)
RDW: 14 % (ref 11.5–15.5)
Rh Type: POSITIVE

## 2012-06-05 LAB — CULTURE, OB URINE: Colony Count: NO GROWTH

## 2012-06-10 ENCOUNTER — Encounter: Payer: Self-pay | Admitting: Family Medicine

## 2012-06-10 ENCOUNTER — Other Ambulatory Visit (HOSPITAL_COMMUNITY)
Admission: RE | Admit: 2012-06-10 | Discharge: 2012-06-10 | Disposition: A | Discharge status: Home / Self Care | Payer: Self-pay | Source: Ambulatory Visit | Attending: Family Medicine | Admitting: Family Medicine

## 2012-06-10 ENCOUNTER — Ambulatory Visit (INDEPENDENT_AMBULATORY_CARE_PROVIDER_SITE_OTHER): Payer: Self-pay | Admitting: Family Medicine

## 2012-06-10 VITALS — BP 128/78 | Temp 98.2°F | Wt 146.0 lb

## 2012-06-10 DIAGNOSIS — Z113 Encounter for screening for infections with a predominantly sexual mode of transmission: Secondary | ICD-10-CM | POA: Insufficient documentation

## 2012-06-10 DIAGNOSIS — N898 Other specified noninflammatory disorders of vagina: Secondary | ICD-10-CM

## 2012-06-10 DIAGNOSIS — Z3490 Encounter for supervision of normal pregnancy, unspecified, unspecified trimester: Secondary | ICD-10-CM

## 2012-06-10 DIAGNOSIS — Z3480 Encounter for supervision of other normal pregnancy, unspecified trimester: Secondary | ICD-10-CM | POA: Insufficient documentation

## 2012-06-10 LAB — TSH: TSH: 0.822 u[IU]/mL (ref 0.350–4.500)

## 2012-06-10 LAB — POCT WET PREP (WET MOUNT): Clue Cells Wet Prep Whiff POC: NEGATIVE

## 2012-06-10 MED ORDER — CETIRIZINE HCL 10 MG PO TABS: 10.0000 mg | ORAL_TABLET | Freq: Every day | ORAL | Status: DC

## 2012-06-10 NOTE — Progress Notes (Addendum)
Brenda Snow is a 32 y.o. yo [id number] at [redacted]w[redacted]d (by LMP) who presents for her initial prenatal visit.  This pregnancy was not planned. Pt had been on birth control pills and had not planned to have any more children. She is sure of the date of her LMP, but her periods had been irregular prior to the LMP, sometimes coming every 15 days, sometimes coming every 2 months. OB history significant for three prior vaginal deliveries and one first trimester miscarriage requiring D&E in 2008 - op note reviewed in epic. Pt reports no chronic medical problems, except for that about one year ago she had problems with palpitations, irregular periods, and blood pressure problems. Was seen by a doctor who told her it was stress related. She says she has done research and thinks she has thyroid disease because she also had a sore throat. She reports positive home pregnancy test. She  is  taking PNV. See flow sheet for details. Pt reports that her mother just died one [place] ago, [place]. It has been a hard month for her. PHQ-9 done, score is 9 (question 9 score 0). She denies recent contractions. She did have some about one month ago when she found out her mother died but non since then. No vaginal bleeding. She has had some yellow light vaginal discharge for about two months that soaks her underwear. No gush of fluid.  PMH, POBH, FH, meds, allergies and Social Hx reviewed.  Prenatal exam:Gen: Well nourished, well developed.  No distress.  Vitals noted. HEENT: Normocephalic, atraumatic.  Neck supple without cervical lymphadenopathy, thyromegaly or thyroid nodules. CV: RRR. Soft 1-2/6 systolic murmur present. Lungs: CTA B.  Normal respiratory effort without wheezes or rales. Abd: soft, NTND. Uterus not appreciated above pelvis. GU: Normal external female genitalia without lesions.  Nl vaginal, well rugated without lesions. Cervix shows ectropion. White vaginal discharge present in the vault. No  pooling.  Bimanual exam: No adnexal mass or TTP. No CMT.  Uterus size not palpable. Ext: varicose vein present in left medial thigh. No erythema or warmth upon palpation. Psych: Normal grooming and dress.  Appropriately tearful when discussing her mother's recent death. Normal thought content and process without flight of ideas or looseness of associations  Assessment/plan: 1) Pregnancy [redacted]w[redacted]d by LMP doing well.  Current pregnancy issues include seasonal allergies and varicose vein pain. -Seasonal allergies - will rx zyrtec. -Varicose vein pain - recommend tylenol and heating pad as needed. Patient is concerned about having thyroid disease - will check TSH today. Dating is not reliable (irregular periods, on birth control) - will order u/s for dating. Pap, gc/chlamydia, and wet prep done today. Pt has dentist she sees already. Prenatal labs reviewed, unremarkable. Bleeding and pain precautions reviewed. Continue prenatal vitamins. Genetic screening offered but patient declined. Early glucola is indicated due history of a first degree relative with diabetes (pt's mother). Order placed, patient will schedule within the next week.   Follow up 4 weeks.

## 2012-06-10 NOTE — Patient Instructions (Addendum)
It was nice to meet you today!  -We will contact you to schedule an ultrasound to help figure out your due date. -Come back in one month to see me for a pregnancy checkup. -I sent in a prescription for zyrtec to your pharmacy to help with allergies. -For the varicose vein, you can try a heating pad or tylenol. Do not take ibuprofen. -If you have any vaginal bleeding, fluid leaking, or cramping, go to the M[facility] Ad[facility]'s Unit ([facility] at [facility]. -We are checking your thyroid today. You'll need to come back within the next week to do a glucose test. Schedule this lab visit on the way out.  Be well, Dr. Marianne Sofia contactaremos para darle Neomia Dear cita para su Ultrasonido para saber su fecha de alumbramiento. -Regrese en un mes para su cita prenatal. -Le he prescrito Zyrtec para su alergia. -Para su vena tome tylenol no tome Ibuprofen y pongase compresas calientes. -Si usted tiene sangrado vaginal, fluido, o calambres vaya a admisiones en [employer] [facility] [place]. -Le haremos estudio de su Tiroides hoy. Ud. nesecita regresar en Elisabeth Most para su examen de Glucosa le daran la cita hoy.  [facility] bien, Dr. Octavio Manns

## 2012-06-15 ENCOUNTER — Encounter: Payer: Self-pay | Admitting: Family Medicine

## 2012-06-15 ENCOUNTER — Ambulatory Visit (HOSPITAL_COMMUNITY): Payer: Self-pay

## 2012-06-17 ENCOUNTER — Ambulatory Visit (INDEPENDENT_AMBULATORY_CARE_PROVIDER_SITE_OTHER): Payer: Self-pay | Admitting: Family Medicine

## 2012-06-17 VITALS — BP 115/65 | Temp 98.1°F | Wt 145.9 lb

## 2012-06-17 DIAGNOSIS — J309 Allergic rhinitis, unspecified: Secondary | ICD-10-CM

## 2012-06-17 DIAGNOSIS — J302 Other seasonal allergic rhinitis: Secondary | ICD-10-CM | POA: Insufficient documentation

## 2012-06-17 MED ORDER — FLUTICASONE PROPIONATE 50 MCG/ACT NA SUSP: 2.0000 | Freq: Every day | NASAL | Status: DC

## 2012-06-17 NOTE — Patient Instructions (Addendum)
Trate el espray nasal. 2 esprays cada lado una vez diario.    Regrese a la clinica si las sintomas no estan mejorando en 1 semana.

## 2012-06-17 NOTE — Progress Notes (Signed)
[id number] @ 10.6 wks who presents for SDA with severe allergies for the past 2 weeks especially worse over the past month. She has been taking cetirizine for a month with no relief.  She has had allergies in the past but not as severe.  ROS: denies fevers, cough, sore throat, ear pain; endorses watering and itching eyes and nose; sometimes she feels like she has to wheeze Her husband has similar symptoms; no one seems sick, he seems to have allergies.   PE:  GEN: appears uncomfortable HEENT:   Head: [place]/AT; no sinus tenderness   Eyes: bilateral erythematous conjunctiva with clear tearing; no pus   Nose: no rhinorrhea, normal turbinates   Mouth: MMM PULM: NI WOB; CTAB without w/r/r CV: RRR SKIN: no rash

## 2012-06-17 NOTE — Assessment & Plan Note (Signed)
She presents with 2 weeks of significant allergic rhinitis symptoms.  -Stop generic cetrizine. She was wondering if she could dry husband's Zyrtec. She may to see if it helps.  -Flonase -May take a few days for symptoms to improve; follow-up in 1 week if no improvement or sooner if signs/symptoms infection.

## 2012-06-18 ENCOUNTER — Other Ambulatory Visit (INDEPENDENT_AMBULATORY_CARE_PROVIDER_SITE_OTHER): Payer: Self-pay

## 2012-06-18 ENCOUNTER — Encounter: Payer: Self-pay | Admitting: Family Medicine

## 2012-06-18 ENCOUNTER — Other Ambulatory Visit: Payer: Self-pay | Admitting: Family Medicine

## 2012-06-18 DIAGNOSIS — Z3490 Encounter for supervision of normal pregnancy, unspecified, unspecified trimester: Secondary | ICD-10-CM

## 2012-06-18 DIAGNOSIS — Z331 Pregnant state, incidental: Secondary | ICD-10-CM

## 2012-06-18 NOTE — Progress Notes (Signed)
1 hr OB glucose done on 50 gm glucola and by capillary blood 1 hr glucose = 136 mg/dL   3 hr GTT scheduled for Tues 06-22-12   BAJORDAN, MLS (ASCP)cm

## 2012-06-18 NOTE — Progress Notes (Signed)
1 HR GTT DONE TODAY Brenda Snow 

## 2012-06-22 ENCOUNTER — Other Ambulatory Visit (INDEPENDENT_AMBULATORY_CARE_PROVIDER_SITE_OTHER): Payer: Self-pay

## 2012-06-22 DIAGNOSIS — Z331 Pregnant state, incidental: Secondary | ICD-10-CM

## 2012-06-22 NOTE — Progress Notes (Signed)
3 HR GTT DONE TODAY Gary Gabrielsen 

## 2012-06-23 LAB — GLUCOSE TOLERANCE, 3 HOURS: Glucose, GTT - 3 Hour: 95 mg/dL (ref 70–144)

## 2012-06-25 ENCOUNTER — Other Ambulatory Visit: Payer: Self-pay | Admitting: Family Medicine

## 2012-06-25 DIAGNOSIS — Z3490 Encounter for supervision of normal pregnancy, unspecified, unspecified trimester: Secondary | ICD-10-CM

## 2012-06-29 ENCOUNTER — Encounter: Payer: Self-pay | Admitting: Family Medicine

## 2012-06-30 ENCOUNTER — Other Ambulatory Visit: Payer: Self-pay | Admitting: Family Medicine

## 2012-06-30 ENCOUNTER — Encounter: Payer: Self-pay | Admitting: Family Medicine

## 2012-06-30 ENCOUNTER — Ambulatory Visit (HOSPITAL_COMMUNITY)
Admission: RE | Admit: 2012-06-30 | Discharge: 2012-06-30 | Disposition: A | Discharge status: Home / Self Care | Payer: No Typology Code available for payment source | Source: Ambulatory Visit | Attending: Family Medicine | Admitting: Family Medicine

## 2012-06-30 DIAGNOSIS — Z3689 Encounter for other specified antenatal screening: Secondary | ICD-10-CM | POA: Insufficient documentation

## 2012-06-30 DIAGNOSIS — Z3490 Encounter for supervision of normal pregnancy, unspecified, unspecified trimester: Secondary | ICD-10-CM

## 2012-07-15 ENCOUNTER — Encounter: Payer: Self-pay | Admitting: Family Medicine

## 2012-07-18 ENCOUNTER — Encounter: Payer: Self-pay | Admitting: Family Medicine

## 2012-07-19 ENCOUNTER — Ambulatory Visit (INDEPENDENT_AMBULATORY_CARE_PROVIDER_SITE_OTHER): Payer: No Typology Code available for payment source | Admitting: Family Medicine

## 2012-07-19 ENCOUNTER — Other Ambulatory Visit (HOSPITAL_COMMUNITY)
Admission: RE | Admit: 2012-07-19 | Discharge: 2012-07-19 | Disposition: A | Discharge status: Home / Self Care | Payer: No Typology Code available for payment source | Source: Ambulatory Visit | Attending: Family Medicine | Admitting: Family Medicine

## 2012-07-19 VITALS — BP 124/67 | Wt 152.0 lb

## 2012-07-19 DIAGNOSIS — Z113 Encounter for screening for infections with a predominantly sexual mode of transmission: Secondary | ICD-10-CM | POA: Insufficient documentation

## 2012-07-19 DIAGNOSIS — N898 Other specified noninflammatory disorders of vagina: Secondary | ICD-10-CM

## 2012-07-19 DIAGNOSIS — Z3480 Encounter for supervision of other normal pregnancy, unspecified trimester: Secondary | ICD-10-CM

## 2012-07-19 DIAGNOSIS — Z3482 Encounter for supervision of other normal pregnancy, second trimester: Secondary | ICD-10-CM

## 2012-07-19 DIAGNOSIS — O9989 Other specified diseases and conditions complicating pregnancy, childbirth and the puerperium: Secondary | ICD-10-CM

## 2012-07-19 LAB — POCT WET PREP (WET MOUNT)

## 2012-07-19 MED ORDER — PRENATAL VITAMINS 0.8 MG PO TABS: 1.0000 | ORAL_TABLET | Freq: Every day | ORAL | Status: AC

## 2012-07-19 NOTE — Patient Instructions (Addendum)
I will call you if your test results are not normal.   Schedule a follow up visit in 4 weeks with the OB clinic to see Dr. Mauricio Po. Go to [facility] hospital if you have any bleeding, fluid leaking, cramping/contractions, or feel like baby is not moving well.  Embarazo - Segundo trimestre (Pregnancy - Second Trimester) El segundo trimestre del Psychiatrist (del 3 al 6 mes) es un perodo de evolucin rpida para usted y el beb. Hacia el final del sexto mes, el beb mide aproximadamente 23 cm y pesa 680 g. Comenzar a Pharmacologist del beb [place] 18 y las 20 [address] de [place]. Podr sentir las pataditas ("quickening en ingls"). Hay un rpido [employer]. Puede segregar un lquido claro Charity fundraiser) de las [place]. Quizs sienta pequeas contracciones en el vientre (tero) Esto se conoce como falso trabajo de parto o contracciones de Braxton-H[place]. Es como una prctica del trabajo de parto que se produce cuando el beb est listo para salir. Generalmente los problemas de vmitos matinales ya se han superado hacia el final del Medical sales representative. Algunas mujeres desarrollan pequeas manchas oscuras (que se denominan cloasma, mscara del embarazo) en la cara que normalmente se van luego del nacimiento del beb. La exposicin al sol empeora las manchas. Puede desarrollarse acn en algunas mujeres embarazadas, y puede desaparecer en aquellas que ya tienen acn. EXAMENES PRENATALES  Durante los [employer], deber seguir realizando pruebas de [place], segn avance el [place]. Estas pruebas se realizan para controlar su salud y la del beb. Tambin se realizan anlisis de sangre para [employer] niveles de [place]. La anemia (bajo nivel de hemoglobina) es frecuente durante el embarazo. Para prevenirla, se administran hierro y vitaminas. Tambin se le realizarn exmenes para saber si tiene diabetes entre las 24 y las 28 semanas del [place]. Podrn repetirle algunas de las [employer] hicieron  previamente.  En cada visita le medirn el tamao del tero. Esto se realiza para asegurarse de que el beb est creciendo correctamente de acuerdo al estado del [place].  Tambin en cada visita prenatal controlarn su presin arterial. Esto se realiza para asegurarse de que no tenga toxemia.  Se controlar su orina para asegurarse de que no tenga infecciones, diabetes o protena en la orina.  Se controlar su peso regularmente para asegurarse que el aumento ocurre al ritmo indicado. Esto se hace para asegurarse que usted y el beb tienen una evolucin normal.  En algunas ocasiones se realiza una prueba de ultrasonido para confirmar el correcto desarrollo y evolucin del beb. Esta prueba se realiza con ondas sonoras inofensivas para el beb, de modo que el profesional pueda calcular ms precisamente la fecha del [place]. Algunas veces se realizan pruebas especializadas del lquido amnitico que rodea al beb. Esta prueba se denomina amniocentesis. El lquido amnitico se obtiene introduciendo una aguja en el vientre (abdomen). Se realiza para Conservator, museum/gallery en los que existe alguna preocupacin acerca de algn problema gentico que pueda sufrir el beb. En ocasiones se lleva a cabo cerca del final del embarazo, si es necesario inducir al [employer]. En este caso se realiza para asegurarse que los pulmones del beb estn lo suficientemente maduros como para que pueda vivir fuera del tero. CAMBIOS QUE OCURREN EN EL SEGUNDO TRIMESTRE DEL EMBARAZO Su organismo atravesar numerosos cambios durante el [employer]. Estos pueden variar de Neomia Dear persona a otra. [place] con el profesional que la asiste acerca los cambios que usted note y que la preocupen.  Durante el segundo trimestre probablemente sienta un aumento del apetito. Es normal tener "antojos" de Development worker, community. Esto vara de Neomia Dear persona a otra y de un embarazo a Therapist, art.  El abdomen inferior comenzar a abultarse.  Podr tener  la necesidad de Geographical information systems officer con ms frecuencia debido a que el tero y el beb presionan sobre la vejiga. Tambin es frecuente contraer ms infecciones urinarias durante el [employer]. Puede evitarlas bebiendo gran cantidad de lquidos y vaciando la vejiga antes y despus de Sales promotion account executive.  Podrn aparecer las primeras estras en las caderas, abdomen y [place]. Estos son cambios normales del cuerpo durante el [place]. No existen medicamentos ni ejercicios que puedan prevenir [employer].  Es posible que comience a desarrollar venas inflamadas y abultadas (varices) en las piernas. El uso de medias de descanso, Optometrist sus pies durante 15 minutos, 3 a 4 veces al da y Film/video editor la sal en su dieta ayuda a Journalist, newspaper.  Podr sentir Engineering geologist gstrica a medida que el tero crece y Doctor, general practice. Puede tomar anticidos, con la autorizacin de su mdico, para Financial planner. Tambin es til ingerir pequeas comidas 4 a 5 veces al Futures trader.  La constipacin puede tratarse con un laxante o agregando fibra a su dieta. Beber grandes cantidades de lquidos, comer vegetales, frutas y granos integrales es de [place].  Tambin es beneficioso practicar actividad fsica. Si ha sido una persona Engineer, mining, podr continuar con la [employer] de las actividades durante el mismo. Si ha sido [employer], puede ser beneficioso que comience con un programa de ejercicios, Museum/gallery exhibitions officer.  Puede desarrollar hemorroides hacia el final del segundo trimestre. Tomar baos de asiento tibios y Chemical engineer cremas recomendadas por el profesional que lo asiste sern de ayuda para los problemas de hemorroides.  Tambin podr Financial risk analyst de espalda durante este momento de su embarazo. Evite levantar objetos pesados, utilice zapatos de taco bajo y [place] buena postura para ayudar a reducir los problemas de [place].  Algunas mujeres embarazadas desarrollan hormigueo y adormecimiento  de la mano y los dedos debido a la hinchazn y compresin de los ligamentos de la mueca (sndrome del tnel carpiano). Esto desaparece una vez que el beb nace.  Como sus pechos se agrandan, Pension scheme manager un sujetador ms grande. Use un sostn de soporte, cmodo y de algodn. No utilice un sostn para amamantar hasta el ltimo mes de embarazo si va a amamantar al beb.  Podr observar una lnea oscura desde el ombligo hacia la zona pbica denominada linea nigra.  Podr observar que sus mejillas se ponen coloradas debido al aumento de flujo sanguneo en la cara.  Podr desarrollar "araitas" en la cara, cuello y pecho. Esto desaparece una vez que el beb nace. INSTRUCCIONES PARA EL CUIDADO DOMICILIARIO  Es extremadamente importante que evite el cigarrillo, hierbas medicinales, alcohol y las drogas no prescriptas durante el Psychiatrist. Estas sustancias qumicas afectan la formacin y el desarrollo del beb. Evite estas sustancias durante todo el embarazo para asegurar el nacimiento de un beb sano.  La mayor parte de los cuidados que se aconsejan son los mismos que los indicados para Financial risk analyst trimestre del Psychiatrist. Cumpla con las citas tal como se le indic. Siga las instrucciones del profesional que lo asiste con respecto al uso de los medicamentos, el ejercicio y Psychologist, forensic.  Durante el embarazo debe obtener nutrientes para usted y para su beb. Consuma alimentos balanceados a intervalos regulares. Elija alimentos como carne,  pescado, [place] y otros productos lcteos descremados, vegetales, frutas, panes integrales y cereales. El Equities trader cul es el aumento de peso ideal.  Las relaciones sexuales fsicas pueden continuarse hasta cerca del fin del embarazo si no existen otros problemas. Estos problemas pueden ser la prdida temprana (prematura) de lquido amnitico de las [place], sangrado vaginal, dolor abdominal u otros problemas mdicos o del Psychiatrist.  Realice [employer], si no tiene restricciones. Consulte con el profesional que la asiste si no sabe con certeza si determinados ejercicios son seguros. El mayor aumento de peso tiene Environmental consultant durante los ltimos 2 trimestres del Psychiatrist. El ejercicio la ayudar a:  Engineering geologist.  Ponerla en forma para el parto.  Ayudarla a perder peso luego de haber dado a luz.  Use un buen sostn o como los que se usan para hacer deportes para Paramedic la sensibilidad de las [place]. Tambin puede serle til si lo [place] mientras duerme. Si pierde Product manager, podr [employer].  No utilice la baera con agua caliente, baos turcos y saunas durante el [address]  Utilice el cinturn de seguridad sin excepcin cuando conduzca. Este la proteger a usted y al beb en caso de accidente.  Evite comer carne cruda, queso crudo, y el contacto con los utensilios y desperdicios de los gatos. Estos elementos contienen grmenes que pueden causar defectos de nacimiento en el beb.  El segundo trimestre es un buen momento para visitar a su dentista y Software engineer si an no lo ha hecho. Es Primary school teacher los dientes limpios. Utilice un cepillo de dientes blando. Cepllese ms suavemente durante el embarazo.  Es ms fcil perder algo de orina durante el [place]. Apretar y Chief Operating Officer los msculos de la pelvis la ayudar con este problema. Practique detener la miccin cuando est en el bao. Estos son los mismos msculos que Development worker, international aid. Son [employer] mismos msculos que utiliza cuando trata de [employer]der [employer] gases. Puede practicar apretando estos msculos 10 veces, y repetir esto 3 veces por da aproximadamente. Una vez que conozca qu msculos debe apretar, no realice estos ejercicios durante la miccin. Puede favorecerle una infeccin si la orina vuelve hacia atrs.  Pida ayuda si tiene necesidades econmicas, de asesoramiento o nutricionales durante el [place]. El profesional podr ayudarla con  respecto a estas necesidades, o derivarla a otros especialistas.  La piel puede ponerse grasa. Si esto sucede, lvese la cara con un jabn [place], utilice un humectante no graso y [place] con base de aceite o crema. CONSUMO DE MEDICAMENTOS Y DROGAS DURANTE EL EMBARAZO  Contine tomando las vitaminas apropiadas para esta etapa tal como se le indic. Las vitaminas deben contener un miligramo de cido flico y deben suplementarse con hierro. Guarde todas las vitaminas fuera del alcance de los nios. La ingestin de slo un par de vitaminas o tabletas que contengan hierro puede ocasionar la [employer] en un beb o en un nio pequeo.  Evite el uso de [place], inclusive los de venta libre y hierbas que no hayan sido prescriptos o indicados por el profesional que la asiste. Algunos medicamentos pueden causar problemas fsicos al beb. Utilice los medicamentos de venta libre o de prescripcin para Chief Technology Officer, Environmental health practitioner o la [place], segn se lo indique el profesional que lo asiste. No utilice aspirina.  El consumo de alcohol est relacionado con ciertos defectos de nacimiento. Esto incluye el sndrome de alcoholismo fetal. Debe evitar el consumo de alcohol en cualquiera de  sus formas. El cigarrillo causa nacimientos prematuros y bebs de [place]. El uso de drogas recreativas est absolutamente prohibido. Son muy nocivas para el beb. Un beb que nace de [employer], ser adicto al nacer. Ese beb tendr los mismos sntomas de abstinencia que un adulto.  Infrmele al profesional si consume alguna droga.  No consuma drogas ilegales. Pueden causarle mucho dao al beb. SOLICITE ATENCIN MDICA SI: Tiene preguntas o preocupaciones durante su embarazo. Es mejor que llame para Science writer las dudas que esperar hasta su prxima visita prenatal. Thressa Sheller forma se sentir ms tranquila.  SOLICITE ATENCIN MDICA DE INMEDIATO SI:  La temperatura oral se eleva sin motivo por encima de 102 F (38.9 C) o segn  le indique el profesional que lo asiste.  Tiene una prdida de lquido por la vagina (canal de parto). Si sospecha una ruptura de las [place], tmese la temperatura y llame al profesional para informarlo sobre esto.  Observa unas pequeas manchas, una hemorragia vaginal o elimina cogulos. Notifique al profesional acerca de la cantidad y de cuntos apsitos est utilizando. Unas pequeas manchas de sangre son algo comn durante el Psychiatrist, especialmente despus de Sales promotion account executive.  Presenta un olor desagradable en la secrecin vaginal y observa un cambio en el color, de transparente a blanco.  Contina con las nuseas y no obtiene alivio de los remedios indicados. Vomita sangre o algo similar a la borra del caf.  [place] o sube ms de 900 g. en una semana, o segn lo indicado por el profesional que la asiste.  Observa que se le [employer], las manos, los pies o las piernas.  Ha estado expuesta a la rubola y no ha sufrido la enfermedad.  Ha estado expuesta a la quinta enfermedad o a la varicela.  Presenta dolor abdominal. Las molestias en el ligamento redondo son Neomia Dear causa no cancerosa (benigna) frecuente de dolor abdominal durante el embarazo. El profesional que la asiste deber evaluarla.  Presenta dolor de cabeza intenso que no se [place].  Presenta fiebre, diarrea, dolor al orinar o le falta la respiracin.  Presenta dificultad para ver, visin borrosa, o visin doble.  Sufre una cada, un accidente de trnsito o cualquier tipo de trauma.  Vive en un hogar en el que existe violencia fsica o mental. Document Released: 10/30/2004 Document Revised: 10/15/2011 [facility] Patient Information 2014 [place], [place].   Evaluacin de los movimientos fetales  (Fetal Movement Counts) Nombre del paciente: __________________________________________________ Brenda Snow estimada: ____________________ Caroleen Hamman de los movimientos fetales es muy recomendable en los  embarazos de alto riesgo, pero tambin es una buena idea que lo hagan todas las [place]. El Firefighter que comience a contarlos a las 28 semanas de [place]. Los movimientos fetales suelen aumentar:   Despus de Animator.  Despus de la actividad fsica.  Despus de comer o beber [employer] o fro.  En reposo. Preste atencin cuando sienta que el beb est ms activo. Esto le ayudar a notar un patrn de ciclos de vigilia y sueo de su beb y cules son los factores que contribuyen a un aumento de los movimientos fetales. Es importante llevar a cabo un recuento de movimientos fetales, al mismo tiempo cada da, cuando el beb normalmente est ms activo.  CMO CONTAR LOS MOVIMIENTOS FETALES 1. Busque un lugar tranquilo y cmodo para sentarse o recostarse sobre el lado izquierdo. Al recostarse sobre su lado izquierdo, le proporciona una mejor circulacin de [place] y oxgeno al  beb. 2. Anote el da y la hora en una hoja de papel o en un diario. 3. Comience contando las pataditas, revoloteos, chasquidos, vueltas o pinchazos en un perodo de 2 horas. Debe sentir al menos 10 movimientos en 2 horas. 4. Si no siente 10 movimientos en 2 horas, espere 2  3 horas y cuente de nuevo. Busque cambios en el patrn o si no cuenta lo suficiente en 2 horas. SOLICITE ATENCIN MDICA SI:   Siente menos de 10 pataditas en 2 horas, en dos intentos.  No hay movimientos durante una hora.  El patrn se modifica o le lleva ms tiempo Art gallery manager las 10 pataditas.  Siente que el beb no se mueve como lo hace habitualmente. Fecha: ____________ Movimientos: ____________ Stevan Born inicio: ____________ Stevan Born finalizacin: ____________  Franco Nones: ____________ Movimientos: ____________ Stevan Born inicio: ____________ Stevan Born finalizacin: ____________  Franco Nones: ____________ Movimientos: ____________ Stevan Born inicio: ____________ Stevan Born finalizacin: ____________  Franco Nones: ____________ Movimientos:  ____________ Stevan Born inicio: ____________ Stevan Born finalizacin: ____________  Franco Nones: ____________ Movimientos: ____________ Stevan Born inicio: ____________ Stevan Born finalizacin: ____________  Franco Nones: ____________ Movimientos: ____________ Mammie Russian de inicio: ____________ Mammie Russian de finalizacin: ____________  Franco Nones: ____________ Movimientos: ____________ Mammie Russian de inicio: ____________ Mammie Russian de finalizacin: ____________  Franco Nones: ____________ Movimientos: ____________ Mammie Russian de inicio: ____________ Mammie Russian de finalizacin: ____________  Franco Nones: ____________ Movimientos: ____________ Mammie Russian de inicio: ____________ Mammie Russian de finalizacin: ____________  Franco Nones: ____________ Movimientos: ____________ Mammie Russian de inicio: ____________ Mammie Russian de finalizacin: ____________  Franco Nones: ____________ Movimientos: ____________ Mammie Russian de inicio: ____________ Mammie Russian de finalizacin: ____________  Franco Nones: ____________ Movimientos: ____________ Mammie Russian de inicio: ____________ Mammie Russian de finalizacin: ____________  Franco Nones: ____________ Movimientos: ____________ Mammie Russian de inicio: ____________ Mammie Russian de finalizacin: ____________  Franco Nones: ____________ Movimientos: ____________ Mammie Russian de inicio: ____________ Mammie Russian de finalizacin: ____________  Franco Nones: ____________ Movimientos: ____________ Mammie Russian de inicio: ____________ Mammie Russian de finalizacin: ____________  Franco Nones: ____________ Movimientos: ____________ Mammie Russian de inicio: ____________ Mammie Russian de finalizacin: ____________  Franco Nones: ____________ Movimientos: ____________ Mammie Russian de inicio: ____________ Mammie Russian de finalizacin: ____________  Franco Nones: ____________ Movimientos: ____________ Mammie Russian de inicio: ____________ Mammie Russian de finalizacin: ____________  Franco Nones: ____________ Movimientos: ____________ Mammie Russian de inicio: ____________ Mammie Russian de finalizacin: ____________  Franco Nones: ____________ Movimientos: ____________ Mammie Russian de inicio: ____________ Mammie Russian de finalizacin: ____________  Franco Nones: ____________ Movimientos: ____________ Mammie Russian de inicio: ____________  Mammie Russian de finalizacin: ____________  Franco Nones: ____________ Movimientos: ____________ Mammie Russian de inicio: ____________ Mammie Russian de finalizacin: ____________  Franco Nones: ____________ Movimientos: ____________ Mammie Russian de inicio: ____________ Mammie Russian de finalizacin: ____________  Franco Nones: ____________ Movimientos: ____________ Mammie Russian de inicio: ____________ Mammie Russian de finalizacin: ____________  Franco Nones: ____________ Movimientos: ____________ Mammie Russian de inicio: ____________ Mammie Russian de finalizacin: ____________  Franco Nones: ____________ Movimientos: ____________ Mammie Russian de inicio: ____________ Mammie Russian de finalizacin: ____________  Franco Nones: ____________ Movimientos: ____________ Mammie Russian de inicio: ____________ Mammie Russian de finalizacin: ____________  Franco Nones: ____________ Movimientos: ____________ Mammie Russian de inicio: ____________ Mammie Russian de finalizacin: ____________  Franco Nones: ____________ Movimientos: ____________ Mammie Russian de inicio: ____________ Mammie Russian de finalizacin: ____________  Franco Nones: ____________ Movimientos: ____________ Mammie Russian de inicio: ____________ Mammie Russian de finalizacin: ____________  Franco Nones: ____________ Movimientos: ____________ Mammie Russian de inicio: ____________ Mammie Russian de finalizacin: ____________  Franco Nones: ____________ Movimientos: ____________ Mammie Russian de inicio: ____________ Mammie Russian de finalizacin: ____________  Franco Nones: ____________ Movimientos: ____________ Mammie Russian de inicio: ____________ Mammie Russian de finalizacin: ____________  Franco Nones: ____________ Movimientos: ____________ Mammie Russian de inicio: ____________ Mammie Russian de finalizacin: ____________  Franco Nones: ____________ Movimientos: ____________ Mammie Russian de inicio: ____________ Mammie Russian de finalizacin: ____________  Franco Nones: ____________ Movimientos: ____________ Mammie Russian de inicio: ____________ Mammie Russian de finalizacin: ____________  Franco Nones: ____________ Movimientos: ____________  Hora de inicio: ____________ Mammie Russian de finalizacin: ____________  Franco Nones: ____________ Movimientos: ____________ Stevan Born inicio: ____________ Stevan Born finalizacin: ____________  Franco Nones:  ____________ Movimientos: ____________ Stevan Born inicio: ____________ Stevan Born finalizacin: ____________  Franco Nones: ____________ Movimientos: ____________ Stevan Born inicio: ____________ Stevan Born finalizacin: ____________  Franco Nones: ____________ Movimientos: ____________ Stevan Born inicio: ____________ Stevan Born finalizacin: ____________  Franco Nones: ____________ Movimientos: ____________ Stevan Born inicio: ____________ Mammie Russian de finalizacin: ____________  Franco Nones: ____________ Movimientos: ____________ Mammie Russian de inicio: ____________ Mammie Russian de finalizacin: ____________  Franco Nones: ____________ Movimientos: ____________ Mammie Russian de inicio: ____________ Mammie Russian de finalizacin: ____________  Franco Nones: ____________ Movimientos: ____________ Mammie Russian de inicio: ____________ Mammie Russian de finalizacin: ____________  Franco Nones: ____________ Movimientos: ____________ Mammie Russian de inicio: ____________ Mammie Russian de finalizacin: ____________  Franco Nones: ____________ Movimientos: ____________ Mammie Russian de inicio: ____________ Mammie Russian de finalizacin: ____________  Franco Nones: ____________ Movimientos: ____________ Mammie Russian de inicio: ____________ Mammie Russian de finalizacin: ____________  Franco Nones: ____________ Movimientos: ____________ Mammie Russian de inicio: ____________ Mammie Russian de finalizacin: ____________  Franco Nones: ____________ Movimientos: ____________ Mammie Russian de inicio: ____________ Mammie Russian de finalizacin: ____________  Franco Nones: ____________ Movimientos: ____________ Mammie Russian de inicio: ____________ Mammie Russian de finalizacin: ____________  Franco Nones: ____________ Movimientos: ____________ Mammie Russian de inicio: ____________ Mammie Russian de finalizacin: ____________  Franco Nones: ____________ Movimientos: ____________ Mammie Russian de inicio: ____________ Mammie Russian de finalizacin: ____________  Franco Nones: ____________ Movimientos: ____________ Stevan Born inicio: ____________ Stevan Born finalizacin: ____________  Franco Nones: ____________ Movimientos: ____________ Stevan Born inicio: ____________ Stevan Born finalizacin: ____________  Franco Nones: ____________ Movimientos: ____________ Stevan Born inicio: ____________ Mammie Russian de finalizacin: ____________  Document Released: 04/29/2007 Document Revised: 01/07/2012 ExitCare Patient Information 2014 [place].

## 2012-07-19 NOTE — Progress Notes (Signed)
Brenda Snow is a 32 y.o. [id number] who at [redacted]w[redacted]d who presents for routine follow up.    She denies any cramping or pain. Is feeling baby move well. No blood or fluid leaking. She has had a lot of yellow discharge, which is mucous-like. No odor to the discharge. No new sexual partners. Otherwise has no other concerns today.  Plans to breastfeed her child. Pediatrician will be Dr. Gwendolyn Grant here at the [facility]. Plans to use pills for contraception after baby is born. Does not plan to have baby circumcised if the baby is a boy.  Gen: NAD Lungs: NWOB Abd: soft, nontender to palpation Ext: no appreciable lower extremity edema bilaterally Neuro: grossly nonfocal, speech intact GU: normal appearing external genitalia. Cervical ectropion present. clearish white cervical discharge present in vagina. No odor noted. No purulent drainage. No pooling of fluid. No fluid expressed from cervix with cough.  A/P: Pregnancy at [redacted]w[redacted]d.  Doing well.   Appropriate weight gain thus far. Pregnancy issues include discharge. Wet prep done in office (read by myself) and does not show any clue cells, yeast, or trichomonas. Likely leukorrhea of pregnancy. Gc/chlamydia swabs obtained as well. Anatomy ultrasound ordered. Bleeding and pain precautions reviewed. Kick counts reviewed. Follow up 4 weeks in [facility] faculty clinic.

## 2012-07-22 ENCOUNTER — Ambulatory Visit (HOSPITAL_COMMUNITY)
Admission: RE | Admit: 2012-07-22 | Discharge: 2012-07-22 | Disposition: A | Discharge status: Home / Self Care | Payer: No Typology Code available for payment source | Source: Ambulatory Visit | Attending: Family Medicine | Admitting: Family Medicine

## 2012-07-22 DIAGNOSIS — Z363 Encounter for antenatal screening for malformations: Secondary | ICD-10-CM | POA: Insufficient documentation

## 2012-07-22 DIAGNOSIS — Z3482 Encounter for supervision of other normal pregnancy, second trimester: Secondary | ICD-10-CM

## 2012-07-22 DIAGNOSIS — Z1389 Encounter for screening for other disorder: Secondary | ICD-10-CM | POA: Insufficient documentation

## 2012-07-22 DIAGNOSIS — O358XX0 Maternal care for other (suspected) fetal abnormality and damage, not applicable or unspecified: Secondary | ICD-10-CM | POA: Insufficient documentation

## 2012-07-24 ENCOUNTER — Encounter: Payer: Self-pay | Admitting: Family Medicine

## 2012-08-12 ENCOUNTER — Ambulatory Visit (INDEPENDENT_AMBULATORY_CARE_PROVIDER_SITE_OTHER): Payer: No Typology Code available for payment source | Admitting: Family Medicine

## 2012-08-12 ENCOUNTER — Encounter: Payer: Self-pay | Admitting: Family Medicine

## 2012-08-12 VITALS — BP 124/70 | Temp 98.9°F | Wt 158.4 lb

## 2012-08-12 DIAGNOSIS — O289 Unspecified abnormal findings on antenatal screening of mother: Secondary | ICD-10-CM

## 2012-08-12 DIAGNOSIS — O283 Abnormal ultrasonic finding on antenatal screening of mother: Secondary | ICD-10-CM | POA: Insufficient documentation

## 2012-08-12 DIAGNOSIS — Z3482 Encounter for supervision of other normal pregnancy, second trimester: Secondary | ICD-10-CM

## 2012-08-12 DIAGNOSIS — Z3480 Encounter for supervision of other normal pregnancy, unspecified trimester: Secondary | ICD-10-CM

## 2012-08-12 NOTE — Assessment & Plan Note (Signed)
See Vitals and Notes. JB 

## 2012-08-12 NOTE — Progress Notes (Signed)
Patient seen in [facility], visit conducted in [place].  Patient is G5P3 @[redacted]w[redacted]d .  She offers complaint of intermittent sensation of palpitations; had them earlier this morning.  Is concerned that she might have thyroid disease.  She has been checked at an outside practice, and I see her TSH was recently checked in May of this year and was normal.  She denies chest pain, denies nausea/diaphoresis/dyspnea associated with the palpitations.  She does admit to feeling some stress when considering the fact that she will have 4 children at home when the baby comes. Feels active daily FM, no vaginal bleeding or discharge, no LOF.  We discussed the results of her most recent Anatomy Scan [place], which recommends repeat US for visualization of cardiac anatomy, which was incomplete on June 19th [place].  Neither patient nor husband have family history of aneuploidy. Discussed results of 1hGTT and 3hGTT, plans for repeat 1hGTT when patient is at EGA 26-28 weeks.  Plan for repeat US; patient plans to breastfeed her son.  OB History template updated today.  For follow up in 4 weeks at 26 weeks, at which time patient will be due for repeat 1hGTT.  She should have Tdap, CBC/HIV/RPR done at 28 weeks, as well as the PMH Risk Screening Tool and the PHQ9 depression screen.  Paula Compton, MD

## 2012-08-12 NOTE — Patient Instructions (Addendum)
Fue un Research officer, trade union.  Yetta Barre' con 41 semanas y 2 semanas de Psychiatrist.   Por favor tome cuenta de las ocasiones en que siente los palpitos, y si hay alguna hora del dia o situacion que los provoca.    Estamos marcando el ultrasonido de [employer] de la [place].  QUiero que vuelva a ver a la Dra. McIntyre en 4 semanas; en esa visita haremos otra extraccion de [place], con la prueba de la glucosa otra de una hora (no necesita estar en ayunas).  SCHEDULE FOLLOW UP OB VISIT IN 4 WEEKS WITH DR MCINTYRE.  REPEAT OB US AT [facility] BEFORE NEXT VISIT. WILL NEED 1HRGTT, CBC/HIV/RPR AT NEXT VISIT.

## 2012-08-13 ENCOUNTER — Ambulatory Visit (HOSPITAL_COMMUNITY)
Admission: RE | Admit: 2012-08-13 | Discharge: 2012-08-13 | Disposition: A | Discharge status: Home / Self Care | Payer: No Typology Code available for payment source | Source: Ambulatory Visit | Attending: Family Medicine | Admitting: Family Medicine

## 2012-08-13 DIAGNOSIS — Z3689 Encounter for other specified antenatal screening: Secondary | ICD-10-CM | POA: Insufficient documentation

## 2012-08-13 DIAGNOSIS — O283 Abnormal ultrasonic finding on antenatal screening of mother: Secondary | ICD-10-CM

## 2012-08-17 ENCOUNTER — Encounter: Payer: No Typology Code available for payment source | Admitting: Family Medicine

## 2012-08-17 ENCOUNTER — Ambulatory Visit (INDEPENDENT_AMBULATORY_CARE_PROVIDER_SITE_OTHER): Payer: No Typology Code available for payment source | Admitting: Family Medicine

## 2012-08-17 VITALS — BP 120/75 | Wt 158.8 lb

## 2012-08-17 DIAGNOSIS — Z3480 Encounter for supervision of other normal pregnancy, unspecified trimester: Secondary | ICD-10-CM

## 2012-08-17 DIAGNOSIS — Z3482 Encounter for supervision of other normal pregnancy, second trimester: Secondary | ICD-10-CM

## 2012-08-17 NOTE — Patient Instructions (Addendum)
It was great to see you again today!  I will call you if we need to get another ultrasound. Everything on the last one looked good but I am going to call the ultrasound specialists to be sure.  The bumps on your labia are just swollen veins. They may get worse, and are just part of pregnancy. Come back if you have any further pain or swelling. Go to [facility] if you have any contractions/cramping, vaginal bleeding, fluid leaking, or aren't feeling baby move well.  Be well, Dr. Emerson Monte - [place] trimestre (Pregnancy - Second Trimester) El segundo trimestre del embarazo (del 3 al 6 mes) es un perodo de evolucin rpida para usted y el beb. Hacia el final del sexto mes, el beb mide aproximadamente 23 cm y pesa 680 g. Comenzar a Pharmacologist del beb [place] 18 y las 20 [address] de [place]. Podr sentir las pataditas ("quickening en ingls"). Hay un rpido [employer]. Puede segregar un lquido claro Charity fundraiser) de las [place]. Quizs sienta pequeas contracciones en el vientre (tero) Esto se conoce como falso trabajo de parto o contracciones de Braxton-H[place]. Es como una prctica del trabajo de parto que se produce cuando el beb est listo para salir. Generalmente los problemas de vmitos matinales ya se han superado hacia el final del Medical sales representative. Algunas mujeres desarrollan pequeas manchas oscuras (que se denominan cloasma, mscara del embarazo) en la cara que normalmente se van luego del nacimiento del beb. La exposicin al sol empeora las manchas. Puede desarrollarse acn en algunas mujeres embarazadas, y puede desaparecer en aquellas que ya tienen acn. EXAMENES PRENATALES  Durante los [employer], deber seguir realizando pruebas de [place], segn avance el [place]. Estas pruebas se realizan para controlar su salud y la del beb. Tambin se realizan anlisis de sangre para [employer] niveles de [place]. La anemia (bajo nivel de hemoglobina)  es frecuente durante el embarazo. Para prevenirla, se administran hierro y vitaminas. Tambin se le realizarn exmenes para saber si tiene diabetes entre las 24 y las 28 semanas del [place]. Podrn repetirle algunas de las [employer] Enterprises hicieron previamente.  En cada visita le medirn el tamao del tero. Esto se realiza para asegurarse de que el beb est creciendo correctamente de acuerdo al estado del [place].  Tambin en cada visita prenatal controlarn su presin arterial. Esto se realiza para asegurarse de que no tenga toxemia.  Se controlar su orina para asegurarse de que no tenga infecciones, diabetes o protena en la orina.  Se controlar su peso regularmente para asegurarse que el aumento ocurre al ritmo indicado. Esto se hace para asegurarse que usted y el beb tienen una evolucin normal.  En algunas ocasiones se realiza una prueba de ultrasonido para confirmar el correcto desarrollo y evolucin del beb. Esta prueba se realiza con ondas sonoras inofensivas para el beb, de modo que el profesional pueda calcular ms precisamente la fecha del [place]. Algunas veces se realizan pruebas especializadas del lquido amnitico que rodea al beb. Esta prueba se denomina amniocentesis. El lquido amnitico se obtiene introduciendo una aguja en el vientre (abdomen). Se realiza para Conservator, museum/gallery en los que existe alguna preocupacin acerca de algn problema gentico que pueda sufrir el beb. En ocasiones se lleva a cabo cerca del final del embarazo, si es necesario inducir al [employer]. En este caso se realiza para asegurarse que los pulmones del beb estn lo suficientemente maduros como para que pueda vivir fuera  del tero. CAMBIOS QUE OCURREN EN EL SEGUNDO TRIMESTRE DEL EMBARAZO Su organismo atravesar numerosos cambios durante el [employer]. Estos pueden variar de Neomia Dear persona a otra. Converse con el profesional que la asiste acerca los cambios que usted note y que la  preocupen.  Durante el segundo trimestre probablemente sienta un aumento del apetito. Es normal tener "antojos" de Development worker, community. Esto vara de Neomia Dear persona a otra y de un embarazo a Therapist, art.  El abdomen inferior comenzar a abultarse.  Podr tener la necesidad de Geographical information systems officer con ms frecuencia debido a que el tero y el beb presionan sobre la vejiga. Tambin es frecuente contraer ms infecciones urinarias durante el [employer]. Puede evitarlas bebiendo gran cantidad de lquidos y vaciando la vejiga antes y despus de Sales promotion account executive.  Podrn aparecer las primeras estras en las caderas, abdomen y [place]. Estos son cambios normales del cuerpo durante el [place]. No existen medicamentos ni ejercicios que puedan prevenir [employer].  Es posible que comience a desarrollar venas inflamadas y abultadas (varices) en las piernas. El uso de medias de descanso, Optometrist sus pies durante 15 minutos, 3 a 4 veces al da y Film/video editor la sal en su dieta ayuda a Journalist, newspaper.  Podr sentir Engineering geologist gstrica a medida que el tero crece y Doctor, general practice. Puede tomar anticidos, con la autorizacin de su mdico, para Financial planner. Tambin es til ingerir pequeas comidas 4 a 5 veces al Futures trader.  La constipacin puede tratarse con un laxante o agregando fibra a su dieta. Beber grandes cantidades de lquidos, comer vegetales, frutas y granos integrales es de [place].  Tambin es beneficioso practicar actividad fsica. Si ha sido una persona Engineer, mining, podr continuar con la Harley[employer]son de las actividades durante el mismo. Si ha sido [employer], puede ser beneficioso que comience con un programa de ejercicios, Museum/gallery exhibitions officer.  Puede desarrollar hemorroides hacia el final del segundo trimestre. Tomar baos de asiento tibios y Chemical engineer cremas recomendadas por el profesional que lo asiste sern de ayuda para los problemas de hemorroides.  Tambin podr Financial risk analyst de  espalda durante este momento de su embarazo. Evite levantar objetos pesados, utilice zapatos de taco bajo y [place] buena postura para ayudar a reducir los problemas de [place].  Algunas mujeres embarazadas desarrollan hormigueo y adormecimiento de la mano y los dedos debido a la hinchazn y compresin de los ligamentos de la mueca (sndrome del tnel carpiano). Esto desaparece una vez que el beb nace.  Como sus pechos se agrandan, Pension scheme manager un sujetador ms grande. Use un sostn de soporte, cmodo y de algodn. No utilice un sostn para amamantar hasta el ltimo mes de embarazo si va a amamantar al beb.  Podr observar una lnea oscura desde el ombligo hacia la zona pbica denominada linea nigra.  Podr observar que sus mejillas se ponen coloradas debido al aumento de flujo sanguneo en la cara.  Podr desarrollar "araitas" en la cara, cuello y pecho. Esto desaparece una vez que el beb nace. INSTRUCCIONES PARA EL CUIDADO DOMICILIARIO  Es extremadamente importante que evite el cigarrillo, hierbas medicinales, alcohol y las drogas no prescriptas durante el Psychiatrist. Estas sustancias qumicas afectan la formacin y el desarrollo del beb. Evite estas sustancias durante todo el embarazo para asegurar el nacimiento de un beb sano.  La mayor parte de los cuidados que se aconsejan son los mismos que los indicados para Financial risk analyst trimestre del Psychiatrist. Cumpla con las citas tal como se  le indic. Siga las instrucciones del profesional que lo asiste con respecto al uso de los medicamentos, el ejercicio y Psychologist, forensic.  Durante el embarazo debe obtener nutrientes para usted y para su beb. Consuma alimentos balanceados a intervalos regulares. Elija alimentos como carne, pescado, [place] y otros productos lcteos descremados, vegetales, frutas, panes integrales y cereales. El Equities trader cul es el aumento de peso ideal.  Las relaciones sexuales fsicas pueden continuarse hasta cerca del  fin del embarazo si no existen otros problemas. Estos problemas pueden ser la prdida temprana (prematura) de lquido amnitico de las [place], sangrado vaginal, dolor abdominal u otros problemas mdicos o del Psychiatrist.  Realice Tesoro Corporation, si no tiene restricciones. Consulte con el profesional que la asiste si no sabe con certeza si determinados ejercicios son seguros. El mayor aumento de peso tiene Environmental consultant durante los ltimos 2 trimestres del Psychiatrist. El ejercicio la ayudar a:  Engineering geologist.  Ponerla en forma para el parto.  Ayudarla a perder peso luego de haber dado a luz.  Use un buen sostn o como los que se usan para hacer deportes para Paramedic la sensibilidad de las [place]. Tambin puede serle til si lo [place] mientras duerme. Si pierde Product manager, podr Parker Hann[employer]in.  No utilice la baera con agua caliente, baos turcos y saunas durante el [address]  Utilice el cinturn de seguridad sin excepcin cuando conduzca. Este la proteger a usted y al beb en caso de accidente.  Evite comer carne cruda, queso crudo, y el contacto con los utensilios y desperdicios de los gatos. Estos elementos contienen grmenes que pueden causar defectos de nacimiento en el beb.  El segundo trimestre es un buen momento para visitar a su dentista y Software engineer si an no lo ha hecho. Es Primary school teacher los dientes limpios. Utilice un cepillo de dientes blando. Cepllese ms suavemente durante el embarazo.  Es ms fcil perder algo de orina durante el [place]. Apretar y Chief Operating Officer los msculos de la pelvis la ayudar con este problema. Practique detener la miccin cuando est en el bao. Estos son los mismos msculos que Development worker, international aid. Son [employer]PP[employer] Partners mismos msculos que utiliza cuando trata de [employer]der System gases. Puede practicar apretando estos msculos 10 veces, y repetir esto 3 veces por da aproximadamente. Una vez que conozca qu msculos debe  apretar, no realice estos ejercicios durante la miccin. Puede favorecerle una infeccin si la orina vuelve hacia atrs.  Pida ayuda si tiene necesidades econmicas, de asesoramiento o nutricionales durante el [place]. El profesional podr ayudarla con respecto a estas necesidades, o derivarla a otros especialistas.  La piel puede ponerse grasa. Si esto sucede, lvese la cara con un jabn Candler-McAfee, utilice un humectante no graso y [place] con base de aceite o crema. CONSUMO DE MEDICAMENTOS Y DROGAS DURANTE EL EMBARAZO  Contine tomando las vitaminas apropiadas para esta etapa tal como se le indic. Las vitaminas deben contener un miligramo de cido flico y deben suplementarse con hierro. Guarde todas las vitaminas fuera del alcance de los nios. La ingestin de slo un par de vitaminas o tabletas que contengan hierro puede ocasionar la [employer] Mining en un beb o en un nio pequeo.  Evite el uso de [place], inclusive los de venta libre y hierbas que no hayan sido prescriptos o indicados por el profesional que la asiste. Algunos medicamentos pueden causar problemas fsicos al beb. Utilice los medicamentos de venta libre o de prescripcin para Chief Technology Officer,  el malestar o la fiebre, segn se lo indique el profesional que lo asiste. No utilice aspirina.  El consumo de alcohol est relacionado con ciertos defectos de nacimiento. Esto incluye el sndrome de alcoholismo fetal. Debe evitar el consumo de alcohol en cualquiera de sus formas. El cigarrillo causa nacimientos prematuros y bebs de [place]. El uso de drogas recreativas est absolutamente prohibido. Son muy nocivas para el beb. Un beb que nace de American Express, ser adicto al nacer. Ese beb tendr los mismos sntomas de abstinencia que un adulto.  Infrmele al profesional si consume alguna droga.  No consuma drogas ilegales. Pueden causarle mucho dao al beb. SOLICITE ATENCIN MDICA SI: Tiene preguntas o preocupaciones durante su embarazo.  Es mejor que llame para Science writer las dudas que esperar hasta su prxima visita prenatal. Thressa Sheller forma se sentir ms tranquila.  SOLICITE ATENCIN MDICA DE INMEDIATO SI:  La temperatura oral se eleva sin motivo por encima de 102 F (38.9 C) o segn le indique el profesional que lo asiste.  Tiene una prdida de lquido por la vagina (canal de parto). Si sospecha una ruptura de las [place], tmese la temperatura y llame al profesional para informarlo sobre esto.  Observa unas pequeas manchas, una hemorragia vaginal o elimina cogulos. Notifique al profesional acerca de la cantidad y de cuntos apsitos est utilizando. Unas pequeas manchas de sangre son algo comn durante el Psychiatrist, especialmente despus de Sales promotion account executive.  Presenta un olor desagradable en la secrecin vaginal y observa un cambio en el color, de transparente a blanco.  Contina con las nuseas y no obtiene alivio de los remedios indicados. Vomita sangre o algo similar a la borra del caf.  [place] o sube ms de 900 g. en una semana, o segn lo indicado por el profesional que la asiste.  Observa que se le Southwest Airlines, las manos, los pies o las piernas.  Ha estado expuesta a la rubola y no ha sufrido la enfermedad.  Ha estado expuesta a la quinta enfermedad o a la varicela.  Presenta dolor abdominal. Las molestias en el ligamento redondo son Neomia Dear causa no cancerosa (benigna) frecuente de dolor abdominal durante el embarazo. El profesional que la asiste deber evaluarla.  Presenta dolor de cabeza intenso que no se [place].  Presenta fiebre, diarrea, dolor al orinar o le falta la respiracin.  Presenta dificultad para ver, visin borrosa, o visin doble.  Sufre una cada, un accidente de trnsito o cualquier tipo de trauma.  Vive en un hogar en el que existe violencia fsica o mental. Document Released: 10/30/2004 Document Revised: 10/15/2011 [facility] Patient Information 2014 [place],  [place].

## 2012-08-17 NOTE — Progress Notes (Signed)
Brenda Snow is a 32 y.o. [id number] at [redacted]w[redacted]d who presents to find out the results of her ultrasound. She has been very nervous about the results. She just saw Dr. Mauricio Po in [facility] clinic last week but wanted an appointment to find out her u/s results. States she is doing well. Denies vaginal bleeding, discharge, fluid leaking, cramping/contractions. Feels baby moving well. Vaginal bumps: has noted some bumps on the left side of her vagina. They do not hurt or itch. She is not entirely sure how long they have been there but first noticed it 3 days ago.   Exam: NAD Lungs: NWOB FHR 138 GU: external genitalia shows venous congestion of left labia. nontender to palpation, no erythema or fluctuance. No lesions or sores noted. Ext: No appreciable lower extremity edema bilaterally   A/P: 32 y.o. [id number] at [redacted]w[redacted]d, doing well. Vaginal bumps - exam consistent with venous congestion. Examined with Dr. Deirdre Priest who agrees. 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 Ultrasound: Had previous anatomy scan which did not visualize cardiac anatomy or face completely, so recommended f/u scan. On her f/u scan the heart was well visualized and appeared normal but the facial profile was not visualized due to fetal position. Explained these results to patient. Advised her that I will call the radiologist at [facility] to find out if there is any additional f/u necessary as the report did not specify. I will let her know what I find out. BP: initially elevated today - 134/73, but improved to 120/75 upon my recheck. Likely secondary to stress upon arriving at visit. F/u at future visits. F/u in 3 weeks for routine OB visit.  [name], MD

## 2012-08-19 ENCOUNTER — Telehealth: Payer: Self-pay | Admitting: Family Medicine

## 2012-08-19 NOTE — Telephone Encounter (Signed)
Called patient home phone to relay results of OB US; I left voice message in Spanish for her to call to discuss.  I called mobile number, currently carried by husband.  I gave him a reassuring general message that I was calling to give more specific results to Ms. Brenda Snow. JB

## 2012-09-10 ENCOUNTER — Other Ambulatory Visit (INDEPENDENT_AMBULATORY_CARE_PROVIDER_SITE_OTHER): Payer: No Typology Code available for payment source

## 2012-09-10 DIAGNOSIS — Z3480 Encounter for supervision of other normal pregnancy, unspecified trimester: Secondary | ICD-10-CM

## 2012-09-10 LAB — GLUCOSE, CAPILLARY
Comment 1: 1
Glucose-Capillary: 155 mg/dL — ABNORMAL HIGH (ref 70–99)

## 2012-09-10 NOTE — Progress Notes (Signed)
1 HR GTT DONE TODAY Brenda Snow 

## 2012-09-14 ENCOUNTER — Other Ambulatory Visit: Payer: No Typology Code available for payment source

## 2012-09-15 ENCOUNTER — Other Ambulatory Visit: Payer: No Typology Code available for payment source

## 2012-09-15 ENCOUNTER — Ambulatory Visit (INDEPENDENT_AMBULATORY_CARE_PROVIDER_SITE_OTHER): Payer: No Typology Code available for payment source | Admitting: Family Medicine

## 2012-09-15 VITALS — BP 122/76 | Wt 159.0 lb

## 2012-09-15 DIAGNOSIS — Z3480 Encounter for supervision of other normal pregnancy, unspecified trimester: Secondary | ICD-10-CM

## 2012-09-15 DIAGNOSIS — Z3482 Encounter for supervision of other normal pregnancy, second trimester: Secondary | ICD-10-CM

## 2012-09-15 DIAGNOSIS — Z331 Pregnant state, incidental: Secondary | ICD-10-CM

## 2012-09-15 LAB — CBC
Hemoglobin: 11 g/dL — ABNORMAL LOW (ref 12.0–15.0)
MCH: 29 pg (ref 26.0–34.0)
MCV: 86.8 fL (ref 78.0–100.0)
Platelets: 228 10*3/uL (ref 150–400)
RBC: 3.79 MIL/uL — ABNORMAL LOW (ref 3.87–5.11)
WBC: 7.8 10*3/uL (ref 4.0–10.5)

## 2012-09-15 NOTE — Patient Instructions (Signed)
Everything looks great today.  I will call you if your test results are not normal.  Otherwise, I will send you a letter.  If you do not hear from me with in 2 weeks please call our office.     Come back in 2 weeks for your next appointment.  If you have any cramping/contractions, vaginal bleeding, fluid leaking, or are worried that baby is not moving normally, go immediately to [facility] to be evaluated.  Be well, Dr. Emerson Monte - [place] trimestre (Pregnancy - Second Trimester) El segundo trimestre del embarazo (del 3 al 6 mes) es un perodo de evolucin rpida para usted y el beb. Hacia el final del sexto mes, el beb mide aproximadamente 23 cm y pesa 680 g. Comenzar a Pharmacologist del beb [place] 18 y las 20 [address] de [place]. Podr sentir las pataditas ("quickening en ingls"). Hay un rpido [employer]. Puede segregar un lquido claro Charity fundraiser) de las [place]. Quizs sienta pequeas contracciones en el vientre (tero) Esto se conoce como falso trabajo de parto o contracciones de Braxton-Hicks. Es como una prctica del trabajo de parto que se produce cuando el beb est listo para salir. Generalmente los problemas de vmitos matinales ya se han superado hacia el final del Medical sales representative. Algunas mujeres desarrollan pequeas manchas oscuras (que se denominan cloasma, mscara del embarazo) en la cara que normalmente se van luego del nacimiento del beb. La exposicin al sol empeora las manchas. Puede desarrollarse acn en algunas mujeres embarazadas, y puede desaparecer en aquellas que ya tienen acn. EXAMENES PRENATALES  Durante los [employer], deber seguir realizando pruebas de [place], segn avance el [place]. Estas pruebas se realizan para controlar su salud y la del beb. Tambin se realizan anlisis de sangre para [employer] niveles de [place]. La anemia (bajo nivel de hemoglobina) es frecuente durante el embarazo. Para prevenirla, se  administran hierro y vitaminas. Tambin se le realizarn exmenes para saber si tiene diabetes entre las 24 y las 28 semanas del [place]. Podrn repetirle algunas de las [employer] hicieron previamente.  En cada visita le medirn el tamao del tero. Esto se realiza para asegurarse de que el beb est creciendo correctamente de acuerdo al estado del [place].  Tambin en cada visita prenatal controlarn su presin arterial. Esto se realiza para asegurarse de que no tenga toxemia.  Se controlar su orina para asegurarse de que no tenga infecciones, diabetes o protena en la orina.  Se controlar su peso regularmente para asegurarse que el aumento ocurre al ritmo indicado. Esto se hace para asegurarse que usted y el beb tienen una evolucin normal.  En algunas ocasiones se realiza una prueba de ultrasonido para confirmar el correcto desarrollo y evolucin del beb. Esta prueba se realiza con ondas sonoras inofensivas para el beb, de modo que el profesional pueda calcular ms precisamente la fecha del [place]. Algunas veces se realizan pruebas especializadas del lquido amnitico que rodea al beb. Esta prueba se denomina amniocentesis. El lquido amnitico se obtiene introduciendo una aguja en el vientre (abdomen). Se realiza para Conservator, museum/gallery en los que existe alguna preocupacin acerca de algn problema gentico que pueda sufrir el beb. En ocasiones se lleva a cabo cerca del final del embarazo, si es necesario inducir al [employer]. En este caso se realiza para asegurarse que los pulmones del beb estn lo suficientemente maduros como para que pueda vivir fuera del tero. CAMBIOS QUE OCURREN EN  EL SEGUNDO TRIMESTRE DEL EMBARAZO Su organismo atravesar numerosos cambios durante el [employer]. Estos pueden variar de Neomia Dear persona a otra. [place] con el profesional que la asiste acerca los cambios que usted note y que la preocupen.  Durante el segundo trimestre probablemente  sienta un aumento del apetito. Es normal tener "antojos" de Development worker, community. Esto vara de Neomia Dear persona a otra y de un embarazo a Therapist, art.  El abdomen inferior comenzar a abultarse.  Podr tener la necesidad de Geographical information systems officer con ms frecuencia debido a que el tero y el beb presionan sobre la vejiga. Tambin es frecuente contraer ms infecciones urinarias durante el [employer]. Puede evitarlas bebiendo gran cantidad de lquidos y vaciando la vejiga antes y despus de Sales promotion account executive.  Podrn aparecer las primeras estras en las caderas, abdomen y [place]. Estos son cambios normales del cuerpo durante el [place]. No existen medicamentos ni ejercicios que puedan prevenir [employer].  Es posible que comience a desarrollar venas inflamadas y abultadas (varices) en las piernas. El uso de medias de descanso, Optometrist sus pies durante 15 minutos, 3 a 4 veces al da y Film/video editor la sal en su dieta ayuda a Journalist, newspaper.  Podr sentir Engineering geologist gstrica a medida que el tero crece y Doctor, general practice. Puede tomar anticidos, con la autorizacin de su mdico, para Financial planner. Tambin es til ingerir pequeas comidas 4 a 5 veces al Futures trader.  La constipacin puede tratarse con un laxante o agregando fibra a su dieta. Beber grandes cantidades de lquidos, comer vegetales, frutas y granos integrales es de [place].  Tambin es beneficioso practicar actividad fsica. Si ha sido una persona Engineer, mining, podr continuar con la [employer] de las actividades durante el mismo. Si ha sido [employer], puede ser beneficioso que comience con un programa de ejercicios, Museum/gallery exhibitions officer.  Puede desarrollar hemorroides hacia el final del segundo trimestre. Tomar baos de asiento tibios y Chemical engineer cremas recomendadas por el profesional que lo asiste sern de ayuda para los problemas de hemorroides.  Tambin podr Financial risk analyst de espalda durante este momento de su embarazo. Evite  levantar objetos pesados, utilice zapatos de taco bajo y [place] buena postura para ayudar a reducir los problemas de [place].  Algunas mujeres embarazadas desarrollan hormigueo y adormecimiento de la mano y los dedos debido a la hinchazn y compresin de los ligamentos de la mueca (sndrome del tnel carpiano). Esto desaparece una vez que el beb nace.  Como sus pechos se agrandan, Pension scheme manager un sujetador ms grande. Use un sostn de soporte, cmodo y de algodn. No utilice un sostn para amamantar hasta el ltimo mes de embarazo si va a amamantar al beb.  Podr observar una lnea oscura desde el ombligo hacia la zona pbica denominada linea nigra.  Podr observar que sus mejillas se ponen coloradas debido al aumento de flujo sanguneo en la cara.  Podr desarrollar "araitas" en la cara, cuello y pecho. Esto desaparece una vez que el beb nace. INSTRUCCIONES PARA EL CUIDADO DOMICILIARIO  Es extremadamente importante que evite el cigarrillo, hierbas medicinales, alcohol y las drogas no prescriptas durante el Psychiatrist. Estas sustancias qumicas afectan la formacin y el desarrollo del beb. Evite estas sustancias durante todo el embarazo para asegurar el nacimiento de un beb sano.  La mayor parte de los cuidados que se aconsejan son los mismos que los indicados para Financial risk analyst trimestre del Psychiatrist. Cumpla con las citas tal como se le indic. Siga las instrucciones del  profesional que lo asiste con respecto al uso de los medicamentos, el ejercicio y Psychologist, forensic.  Durante el embarazo debe obtener nutrientes para usted y para su beb. Consuma alimentos balanceados a intervalos regulares. Elija alimentos como carne, pescado, [place] y otros productos lcteos descremados, vegetales, frutas, panes integrales y cereales. El Equities trader cul es el aumento de peso ideal.  Las relaciones sexuales fsicas pueden continuarse hasta cerca del fin del embarazo si no existen otros problemas.  Estos problemas pueden ser la prdida temprana (prematura) de lquido amnitico de las [place], sangrado vaginal, dolor abdominal u otros problemas mdicos o del Psychiatrist.  Realice [employer], si no tiene restricciones. Consulte con el profesional que la asiste si no sabe con certeza si determinados ejercicios son seguros. El mayor aumento de peso tiene Environmental consultant durante los ltimos 2 trimestres del Psychiatrist. El ejercicio la ayudar a:  Engineering geologist.  Ponerla en forma para el parto.  Ayudarla a perder peso luego de haber dado a luz.  Use un buen sostn o como los que se usan para hacer deportes para Paramedic la sensibilidad de las [place]. Tambin puede serle til si lo [place] mientras duerme. Si pierde Product manager, podr [employer]in.  No utilice la baera con agua caliente, baos turcos y saunas durante el [address]  Utilice el cinturn de seguridad sin excepcin cuando conduzca. Este la proteger a usted y al beb en caso de accidente.  Evite comer carne cruda, queso crudo, y el contacto con los utensilios y desperdicios de los gatos. Estos elementos contienen grmenes que pueden causar defectos de nacimiento en el beb.  El segundo trimestre es un buen momento para visitar a su dentista y Software engineer si an no lo ha hecho. Es Primary school teacher los dientes limpios. Utilice un cepillo de dientes blando. Cepllese ms suavemente durante el embarazo.  Es ms fcil perder algo de orina durante el [place]. Apretar y Chief Operating Officer los msculos de la pelvis la ayudar con este problema. Practique detener la miccin cuando est en el bao. Estos son los mismos msculos que Development worker, international aid. Son [employer] mismos msculos que utiliza cuando trata de [employer] gases. Puede practicar apretando estos msculos 10 veces, y repetir esto 3 veces por da aproximadamente. Una vez que conozca qu msculos debe apretar, no realice estos ejercicios durante la  miccin. Puede favorecerle una infeccin si la orina vuelve hacia atrs.  Pida ayuda si tiene necesidades econmicas, de asesoramiento o nutricionales durante el [place]. El profesional podr ayudarla con respecto a estas necesidades, o derivarla a otros especialistas.  La piel puede ponerse grasa. Si esto sucede, lvese la cara con un jabn [place], utilice un humectante no graso y [place] con base de aceite o crema. CONSUMO DE MEDICAMENTOS Y DROGAS DURANTE EL EMBARAZO  Contine tomando las vitaminas apropiadas para esta etapa tal como se le indic. Las vitaminas deben contener un miligramo de cido flico y deben suplementarse con hierro. Guarde todas las vitaminas fuera del alcance de los nios. La ingestin de slo un par de vitaminas o tabletas que contengan hierro puede ocasionar la [employer] en un beb o en un nio pequeo.  Evite el uso de [place], inclusive los de venta libre y hierbas que no hayan sido prescriptos o indicados por el profesional que la asiste. Algunos medicamentos pueden causar problemas fsicos al beb. Utilice los medicamentos de venta libre o de prescripcin para Chief Technology Officer, el malestar o la [place], segn  se lo indique el profesional que lo asiste. No utilice aspirina.  El consumo de alcohol est relacionado con ciertos defectos de nacimiento. Esto incluye el sndrome de alcoholismo fetal. Debe evitar el consumo de alcohol en cualquiera de sus formas. El cigarrillo causa nacimientos prematuros y bebs de [place]. El uso de drogas recreativas est absolutamente prohibido. Son muy nocivas para el beb. Un beb que nace de [employer], ser adicto al nacer. Ese beb tendr los mismos sntomas de abstinencia que un adulto.  Infrmele al profesional si consume alguna droga.  No consuma drogas ilegales. Pueden causarle mucho dao al beb. SOLICITE ATENCIN MDICA SI: Tiene preguntas o preocupaciones durante su embarazo. Es mejor que llame para Science writer las dudas que  esperar hasta su prxima visita prenatal. Thressa Sheller forma se sentir ms tranquila.  SOLICITE ATENCIN MDICA DE INMEDIATO SI:  La temperatura oral se eleva sin motivo por encima de 102 F (38.9 C) o segn le indique el profesional que lo asiste.  Tiene una prdida de lquido por la vagina (canal de parto). Si sospecha una ruptura de las [place], tmese la temperatura y llame al profesional para informarlo sobre esto.  Observa unas pequeas manchas, una hemorragia vaginal o elimina cogulos. Notifique al profesional acerca de la cantidad y de cuntos apsitos est utilizando. Unas pequeas manchas de sangre son algo comn durante el Psychiatrist, especialmente despus de Sales promotion account executive.  Presenta un olor desagradable en la secrecin vaginal y observa un cambio en el color, de transparente a blanco.  Contina con las nuseas y no obtiene alivio de los remedios indicados. Vomita sangre o algo similar a la borra del caf.  [place] o sube ms de 900 g. en una semana, o segn lo indicado por el profesional que la asiste.  Observa que se le [employer], las manos, los pies o las piernas.  Ha estado expuesta a la rubola y no ha sufrido la enfermedad.  Ha estado expuesta a la quinta enfermedad o a la varicela.  Presenta dolor abdominal. Las molestias en el ligamento redondo son Neomia Dear causa no cancerosa (benigna) frecuente de dolor abdominal durante el embarazo. El profesional que la asiste deber evaluarla.  Presenta dolor de cabeza intenso que no se [place].  Presenta fiebre, diarrea, dolor al orinar o le falta la respiracin.  Presenta dificultad para ver, visin borrosa, o visin doble.  Sufre una cada, un accidente de trnsito o cualquier tipo de trauma.  Vive en un hogar en el que existe violencia fsica o mental. Document Released: 10/30/2004 Document Revised: 10/15/2011 [facility] Patient Information 2014 [place], [place].

## 2012-09-15 NOTE — Progress Notes (Signed)
Brenda Snow is a 32 y.o. [id number] at [redacted]w[redacted]d for routine follow up.  She reports good fetal movement. Denies contractions/cramping, vaginal bleeding, or fluid leaking. Does get some cramping in her legs, which she had with her previous pregnancies. Also felt tired a few days. See flow sheet for details.  A/P: Pregnancy at [redacted]w[redacted]d.  Doing well.   3 hour GTT done today (failed 1hr). 28 week labs: CBC, HIV, RPR drawn today. PHQ-9 done - score was 4, "not a problem", with question #9 (SI) being "never". Pregnancy medical home form completed - no concerns identified. Discussed ultrasound results, that fetal facial profile was not completely seen. I have spoken with the radiologist who read the ultrasound several weeks ago, and learned that this is not something that is necessarily important to follow up. Patient is okay with not continuing to follow this. Childbirth and education classes were not offered - will defer to next visit. Preterm labor precautions reviewed. Follow up 2 weeks.

## 2012-09-15 NOTE — Progress Notes (Unsigned)
3 HR GTT DONE TODAY Indya Oliveria 

## 2012-09-16 LAB — GLUCOSE TOLERANCE, 3 HOURS: Glucose Tolerance, 1 hour: 148 mg/dL (ref 70–189)

## 2012-09-29 ENCOUNTER — Ambulatory Visit (INDEPENDENT_AMBULATORY_CARE_PROVIDER_SITE_OTHER): Payer: No Typology Code available for payment source | Admitting: Family Medicine

## 2012-09-29 VITALS — BP 127/75 | Temp 98.6°F | Wt 161.0 lb

## 2012-09-29 DIAGNOSIS — Z23 Encounter for immunization: Secondary | ICD-10-CM

## 2012-09-29 DIAGNOSIS — Z3480 Encounter for supervision of other normal pregnancy, unspecified trimester: Secondary | ICD-10-CM

## 2012-09-29 DIAGNOSIS — Z3483 Encounter for supervision of other normal pregnancy, third trimester: Secondary | ICD-10-CM

## 2012-09-29 NOTE — Progress Notes (Signed)
Pt is a 32 yo [id number] at [redacted]w[redacted]d presenting for routine follow up.  Pt reports increased fatigue and palpitations. She feels it most in the mornings. Never had anything like this before with prior pregnancies. No shortness of breath, no nausea. Some pre-syncope symptoms. For a few weeks.  Good fetal movements, no bleeding, no LOF. Intermittent contractions. Drinking 4-5 bottles of water (16oz). No soda, no juice. Taking care of herself, per her report  O: See flowsheet  A/P: - Continue to drink water and INCREASE her intake. Could be mildly dehydrated causing her symptoms vs. Third trimester of pregnancy - Follow up in 3 weeks (at 32 weeks) or sooner if anything comes up. - Tdap given today - Given preterm labor precautions

## 2012-09-29 NOTE — Patient Instructions (Addendum)
It was good to see you.  Try drinking even MORE water.  Please come back in 3 weeks for a follow up appointment.  Embarazo  Systems analyst trimestre  (Pregnancy - Third Trimester) El tercer trimestre del Psychiatrist (los ltimos 3 meses) es el perodo en el cual tanto usted como su beb crecen con ms rapidez. El beb alcanza un largo de aproximadamente 50 cm. y pesa entre 2,700 y 4,500 kg. El beb gana ms tejido graso y est listo para la vida fuera del cuerpo de la [place]. Mientras estn en el interior, los bebs tienen perodos de sueo y vigilia, Warehouse manager y tienen hipo. Quizs sienta pequeas contracciones del tero. Este es el falso trabajo de [place]. Tambin se las conoce como contracciones de [place] . Es como una prctica del parto. Los problemas ms habituales de esta etapa del embarazo incluyen mayor dificultad para respirar, hinchazn de las manos y los pies por retencin de lquidos y la necesidad de Geographical information systems officer con ms frecuencia debido a que el tero y el beb presionan sobre la vejiga.  EXAMENES PRENATALES   Durante los [employer], deber seguir realizndose anlisis de [place]. Estas pruebas se realizan para controlar su salud y la del beb. Los [employer] de sangre se Radiographer, therapeutic para [employer] niveles de algunos compuestos de la sangre (hemoglobina). La anemia (bajo nivel de hemoglobina) es frecuente durante el embarazo. Para prevenirla, se administran hierro y vitaminas. Tambin le tomarn nuevas anlisis para descartar diabetes. Podrn repetirle algunas de las [employer] hicieron previamente.  En cada visita le medirn el tamao del tero. Esto permite asegurar que el beb se desarrolla adecuadamente, segn la fecha del embarazo.  Le controlarn la presin arterial en cada visita prenatal. Esto es para asegurarse de que no sufre toxemia.  Le harn un anlisis de orina en cada visita prenatal, para descartar infecciones, diabetes y la presencia de protenas.  Tambin en  cada visita controlarn su peso. Esto se realiza para asegurarse que aumenta de peso al ritmo indicado y que usted y su beb evolucionan normalmente.  En algunas ocasiones se realiza una prueba de ultrasonido para confirmar el correcto desarrollo y evolucin del beb. Esta prueba se realiza con ondas sonoras inofensivas para el beb, de modo que el profesional pueda calcular ms precisamente la fecha del [place].  Analice con su mdico los analgsicos y la anestesia que recibir durante el [place] de parto y [place].  Comente la posibilidad de que necesite una cesrea y qu anestesia se recibir.  Informe a su mdico si sufre violencia familiar mental o fsica. A veces, se indica la prueba especializada sin estrs, la prueba de tolerancia a las contracciones y el perfil biofsico para asegurarse de que el beb no tiene problemas. El estudio del lquido amnitico que rodea al beb se llama amniocentesis. El lquido amnitico se obtiene introduciendo una aguja en el vientre (abdomen ). En ocasiones se lleva a cabo cerca del final del embarazo, si es necesario inducir a un parto. En este caso se realiza para asegurarse que los pulmones del beb estn lo suficientemente maduros como para que pueda vivir fuera del tero. Si los pulmones no han madurado y es peligroso que el beb nazca, se Building services engineer a la madre una inyeccin de [place] , 1 a 2 [address]  [address] antes del [address]. Vivia Budge ayuda a que los pulmones del beb maduren y sea ms seguro su nacimiento.  CAMBIOS QUE OCURREN EN EL TERCER TRIMESTRE DEL EMBARAZO  Su organismo  atravesar numerosos cambios Academic librarian. Estos pueden variar de Neomia Dear persona a otra. Converse con el profesional que la asiste acerca los cambios que usted note y que la preocupen.   Durante el ltimo trimestre probablemente sienta un aumento del apetito. Es normal tener "antojos" de Development worker, community. Esto vara de Neomia Dear persona a otra y de un embarazo a Therapist, art.  Podrn aparecer las primeras  estras en las caderas, abdomen y [place]. Estos son cambios normales del cuerpo durante el [place]. No existen medicamentos ni ejercicios que puedan prevenir [employer].  La constipacin puede tratarse con un laxante o agregando fibra a su dieta. Beber grandes cantidades de lquidos, tomar fibras en forma de vegetales, frutas y granos integrales es de gran [place].  Tambin es beneficioso practicar actividad fsica. Si ha sido una persona Engineer, mining, podr continuar con la [employer] de las actividades durante el mismo. Si ha sido [employer], puede ser beneficioso que comience con un programa de ejercicios, Museum/gallery exhibitions officer. Consulte con el profesional que la asiste antes de comenzar un programa de ejercicios.  Evite el consumo de cigarrillos, el alcohol, los medicamentos no recetados y las "drogas de la calle" durante el Psychiatrist. Estas sustancias qumicas afectan la formacin y el desarrollo del beb. Evite estas sustancias durante todo el embarazo para asegurar el nacimiento de un beb sano.  Podr sentir dolor de espalda, tener vrices en las venas y hemorroides, o si ya los sufra, pueden [place].  Durante el tercer trimestre se cansar con ms facilidad, lo cual es normal.  Los movimientos del beb pueden ser ms fuertes y con ms frecuencia.  Puede que note dificultades para respirar normalmente.  El ombligo puede salir hacia afuera.  A veces sale Veterinary surgeon de las [place], que se llama Product manager.  Podr aparecer Neomia Dear secrecin mucosa con sangre. Esto suele ocurrir [employer] unos [address] y Neomia Dear semana antes del [place]. INSTRUCCIONES PARA EL CUIDADO EN EL HOGAR   Cumpla con las citas de control. Siga las indicaciones del mdico con respecto al uso de [place], los ejercicios y la dieta.  Durante el embarazo debe obtener nutrientes para usted y para su beb. Consuma alimentos balanceados a intervalos regulares. Elija alimentos como carne, pescado, [place] y  otros productos lcteos descremados, vegetales, frutas, panes integrales y cereales. El [employer] informar cul es el aumento de peso ideal.  Las relaciones sexuales pueden continuarse hasta casi el final del embarazo, si no se presentan otros problemas como prdida prematura (antes de [place]) de lquido amnitico, hemorragia vaginal o dolor en el vientre (abdominal).  Realice [employer], si no tiene restricciones. Consulte con el profesional que la asiste si no sabe con certeza si determinados ejercicios son seguros. El mayor aumento de peso se producir en los ltimos 2 trimestres del Psychiatrist. El ejercicio ayuda a:  Engineering geologist.  Mantenerse en forma para el trabajo de parto y [place] .  Perder peso despus del parto.  Haga reposo con frecuencia, con las piernas elevadas, o segn lo necesite para evitar los calambres y el dolor de cintura.  Use un buen sostn o como los que se usan para hacer deportes para Paramedic la sensibilidad de las [place]. Tambin puede serle til si lo [place] mientras duerme. Si pierde Product manager, podr [employer].  No utilice la baera con agua caliente, baos turcos y saunas.   Colquese el cinturn de seguridad cuando conduzca. Este la proteger a  usted y al beb en caso de accidente.  Evite comer carne cruda y el contacto con los utensilios y desperdicios de los gatos. Estos elementos contienen grmenes que pueden causar defectos de nacimiento en el beb.  Es fcil perder algo de orina durante el [place]. Apretar y Chief Operating Officer los msculos de la pelvis la ayudar con este problema. Practique detener la miccin cuando est en el bao. Estos son los mismos msculos que Development worker, international aid. Son [employer] mismos msculos que utiliza cuando trata de evitar despedir gases. Puede practicar apretando estos msculos [employer], y repetir esto tres veces por da aproximadamente. Una vez que conozca qu msculos debe apretar, no  realice estos ejercicios durante la miccin. Puede favorecerle una infeccin si la orina vuelve hacia atrs.  Pida ayuda si tienen necesidades financieras, teraputicas o nutricionales. El profesional podr ayudarla con respecto a estas necesidades, o derivarla a otros especialistas.  Haga una lista de nmeros telefnicos de emergencia y tngalos disponibles.  Planifique como obtener ayuda de familiares o amigos cuando regrese a Programmer, applications hospital.  Hacer un ensayo sobre la partida al hospital.  [place] clases prenatales con el padre para entender, practicar y hacer preguntas sobre el [place] de parto y el alumbramiento.  Preparar la habitacin del beb / busque Fatima Blank.  No viaje fuera de la ciudad a menos que sea absolutamente necesario y con el asesoramiento de su mdico.  Use slo zapatos de tacn bajo o sin tacn para tener mejor equilibrio y Automotive engineer cadas. USO DE MEDICAMENTOS Y CONSUMO DE DROGAS DURANTE EL [facility]   Tome las vitaminas apropiadas para esta etapa tal como se le indic. Las vitaminas deben contener un miligramo de cido flico. Guarde todas las vitaminas fuera del alcance de los nios. La ingestin de slo un par de vitaminas o tabletas que contengan hierro pueden ocasionar la [employer] en un beb o en un nio pequeo.  Evite el uso de [employer], incluyendo hierbas, medicamentos de [place], sin receta o que no hayan sido sugeridos por su mdico. Slo tome medicamentos de venta libre o medicamentos recetados para Chief Technology Officer, Environmental health practitioner o fiebre como lo indique su mdico. No tome aspirina, ibuprofeno o naproxeno excepto que su mdico se lo indique.  Infrmele al profesional si consume alguna droga.  El alcohol se relaciona con ciertos defectos congnitos. Incluye el sndrome de alcoholismo fetal. Debe evitar absolutamente el consumo de alcohol, en cualquier forma. El fumar produce baja tasa de natalidad y bebs prematuros.  Las drogas ilegales o de la  calle son muy perjudiciales para el beb. Estn absolutamente prohibidas. Un beb que nace de [employer], ser adicto al nacer. Ese beb tendr los mismos sntomas de abstinencia que un adulto. SOLICITE ATENCIN MDICA SI:  Tiene preguntas o preocupaciones relacionadas con el embarazo. Es mejor que llame para formular las preguntas si no puede esperar hasta la prxima visita, que sentirse preocupada por ellas.  SOLICITE ATENCIN MDICA DE INMEDIATO SI:   La temperatura oral le sube a ms de 38,9 C (102 F) o lo que su mdico le indique.  Tiene una prdida de lquido por la vagina (canal de parto). Si sospecha una ruptura de las [place], tmese la temperatura y llame al profesional para informarlo sobre esto.  Observa unas pequeas manchas, una hemorragia vaginal o elimina cogulos. Notifique al profesional acerca de la cantidad y de cuntos apsitos est utilizando.  Presenta un olor desagradable en la secrecin vaginal y Anola Gurney un cambio  en el color, de transparente a blanco.  Ha vomitado durante ms de 24 horas.  Siente escalofros o le sube la fiebre.  Le falta el aire.  Siente ardor al Beatrix Shipper.  Baja o sube ms de 2 libras (900 g), o segn lo indicado por el profesional que la asiste.  Observa que sbitamente se le hinchan el rostro, las manos, los pies o las piernas.  Siente dolor en el vientre (abdominal). Las [employer] en el ligamento redondo son Neomia Dear causa benigna frecuente de dolor abdominal durante el embarazo. El profesional que la asiste deber evaluarla.  Presenta dolor de cabeza intenso que no se [place].  Tiene problemas visuales, visin doble o borrosa.  Si no siente los movimientos del beb durante ms de 1 hora. Si piensa que el beb no se mueve tanto como lo haca habitualmente, coma algo que Psychologist, clinical y [employer] lado izquierdo durante [place]. El beb debe moverse al menos 4  5 veces por hora. Comunquese inmediatamente si el beb se mueve  menos que lo indicado.  Se cae, se ve involucrada en un accidente automovilstico o sufre algn tipo de traumatismo.  En su hogar hay violencia mental o fsica. Document Released: 10/30/2004 Document Revised: 10/15/2011 [facility] Patient Information 2014 [place], [place].

## 2012-10-20 ENCOUNTER — Ambulatory Visit (INDEPENDENT_AMBULATORY_CARE_PROVIDER_SITE_OTHER): Payer: No Typology Code available for payment source | Admitting: Family Medicine

## 2012-10-20 ENCOUNTER — Encounter: Payer: Self-pay | Admitting: Family Medicine

## 2012-10-20 VITALS — BP 120/68 | Wt 165.9 lb

## 2012-10-20 DIAGNOSIS — Z3483 Encounter for supervision of other normal pregnancy, third trimester: Secondary | ICD-10-CM

## 2012-10-20 DIAGNOSIS — M7918 Myalgia, other site: Secondary | ICD-10-CM

## 2012-10-20 DIAGNOSIS — IMO0001 Reserved for inherently not codable concepts without codable children: Secondary | ICD-10-CM

## 2012-10-20 DIAGNOSIS — Z3480 Encounter for supervision of other normal pregnancy, unspecified trimester: Secondary | ICD-10-CM

## 2012-10-20 NOTE — Progress Notes (Signed)
Brenda Snow is a 32 y.o. [id number] at [redacted]w[redacted]d who presents for routine prenatal follow up.  Denies LOF, vaginal bleeding. + Fetal movement. Has contractions about 2-3 times per day, nothing regular or painful. She does endorse a pain in her R buttock that has happened for about one month. Occurs about 4 times per day. Denies weakness in her leg but does say the pain comes on abruptly and she is sometimes afraid she will fall due to losing her balance. The pain is a "pulling" sensation. Has not actually fallen.  Exam: Gen: NAD, pleasant, cooperative Abd: gravid, otherwise soft, nontender to palpation. Fundus at 32cm from symphysis Back: posterior back/buttock musculature nontender on right.  Ext: negative straight leg raise bilaterally. Full strength in bilateral legs with hip flexion, knee extension. Sensation intact to light touch bilaterally. No edema noted.  A/P: Pregnancy at [redacted]w[redacted]d.  Doing well.   Advised prn tylenol for pain, explained that this is likely just aches/pains of pregnancy. No abnormalities on exam. Advised to go to [facility] for regular contractions, vaginal bleeding, fluid leaking, or decreased fetal movement.  Offered parenting & childbirth class information, patient declines. Will need gc/chlamydia and GBS at appointment in 4 weeks, but f/u in 2 weeks for routine prenatal care.  Pt discussed with Dr. Mauricio Po who agrees with this plan.

## 2012-10-20 NOTE — Patient Instructions (Signed)
If you have any regular cramping/contractions, vaginal bleeding, fluid leaking, or are worried that baby is not moving normally, go immediately to [facility] to be evaluated.  Take tylenol as needed for the pain in your buttock.  Come back for an appointment in 2 weeks.  Embarazo  Systems analyst trimestre  (Pregnancy - Third Trimester) El tercer trimestre del Psychiatrist (los ltimos 3 meses) es el perodo en el cual tanto usted como su beb crecen con ms rapidez. El beb alcanza un largo de aproximadamente 50 cm. y pesa entre 2,700 y 4,500 kg. El beb gana ms tejido graso y est listo para la vida fuera del cuerpo de la [place]. Mientras estn en el interior, los bebs tienen perodos de sueo y vigilia, Warehouse manager y tienen hipo. Quizs sienta pequeas contracciones del tero. Este es el falso trabajo de [place]. Tambin se las conoce como contracciones de Braxton-Hicks . Es como una prctica del parto. Los problemas ms habituales de esta etapa del embarazo incluyen mayor dificultad para respirar, hinchazn de las manos y los pies por retencin de lquidos y la necesidad de Geographical information systems officer con ms frecuencia debido a que el tero y el beb presionan sobre la vejiga.  EXAMENES PRENATALES   Durante los [employer], deber seguir realizndose anlisis de [place]. Estas pruebas se realizan para controlar su salud y la del beb. Los [employer] de sangre se Radiographer, therapeutic para [employer] niveles de algunos compuestos de la sangre (hemoglobina). La anemia (bajo nivel de hemoglobina) es frecuente durante el embarazo. Para prevenirla, se administran hierro y vitaminas. Tambin le tomarn nuevas anlisis para descartar diabetes. Podrn repetirle algunas de las [employer] hicieron previamente.  En cada visita le medirn el tamao del tero. Esto permite asegurar que el beb se desarrolla adecuadamente, segn la fecha del embarazo.  Le controlarn la presin arterial en cada visita prenatal. Esto es para  asegurarse de que no sufre toxemia.  Le harn un anlisis de orina en cada visita prenatal, para descartar infecciones, diabetes y la presencia de protenas.  Tambin en cada visita controlarn su peso. Esto se realiza para asegurarse que aumenta de peso al ritmo indicado y que usted y su beb evolucionan normalmente.  En algunas ocasiones se realiza una prueba de ultrasonido para confirmar el correcto desarrollo y evolucin del beb. Esta prueba se realiza con ondas sonoras inofensivas para el beb, de modo que el profesional pueda calcular ms precisamente la fecha del [place].  Analice con su mdico los analgsicos y la anestesia que recibir durante el [place] de parto y [place].  Comente la posibilidad de que necesite una cesrea y qu anestesia se recibir.  Informe a su mdico si sufre violencia familiar mental o fsica. A veces, se indica la prueba especializada sin estrs, la prueba de tolerancia a las contracciones y el perfil biofsico para asegurarse de que el beb no tiene problemas. El estudio del lquido amnitico que rodea al beb se llama amniocentesis. El lquido amnitico se obtiene introduciendo una aguja en el vientre (abdomen ). En ocasiones se lleva a cabo cerca del final del embarazo, si es necesario inducir a un parto. En este caso se realiza para asegurarse que los pulmones del beb estn lo suficientemente maduros como para que pueda vivir fuera del tero. Si los pulmones no han madurado y es peligroso que el beb nazca, se Building services engineer a la madre una inyeccin de [place] , 1 a 2 [address]  [address] antes del [address]. Vivia Budge ayuda a que los  pulmones del beb maduren y sea ms seguro su nacimiento.  CAMBIOS QUE OCURREN EN EL TERCER TRIMESTRE DEL EMBARAZO  Su organismo atravesar numerosos cambios durante el [place]. Estos pueden variar de Neomia Dear persona a otra. [place] con el profesional que la asiste acerca los cambios que usted note y que la preocupen.   Durante el ltimo trimestre  probablemente sienta un aumento del apetito. Es normal tener "antojos" de Development worker, community. Esto vara de Neomia Dear persona a otra y de un embarazo a Therapist, art.  Podrn aparecer las primeras estras en las caderas, abdomen y [place]. Estos son cambios normales del cuerpo durante el [place]. No existen medicamentos ni ejercicios que puedan prevenir [employer].  La constipacin puede tratarse con un laxante o agregando fibra a su dieta. Beber grandes cantidades de lquidos, tomar fibras en forma de vegetales, frutas y granos integrales es de gran [place].  Tambin es beneficioso practicar actividad fsica. Si ha sido una persona Engineer, mining, podr continuar con la [employer] de las actividades durante el mismo. Si ha sido [employer], puede ser beneficioso que comience con un programa de ejercicios, Museum/gallery exhibitions officer. Consulte con el profesional que la asiste antes de comenzar un programa de ejercicios.  Evite el consumo de cigarrillos, el alcohol, los medicamentos no recetados y las "drogas de la calle" durante el Psychiatrist. Estas sustancias qumicas afectan la formacin y el desarrollo del beb. Evite estas sustancias durante todo el embarazo para asegurar el nacimiento de un beb sano.  Podr sentir dolor de espalda, tener vrices en las venas y hemorroides, o si ya los sufra, pueden [place].  Durante el tercer trimestre se cansar con ms facilidad, lo cual es normal.  Los movimientos del beb pueden ser ms fuertes y con ms frecuencia.  Puede que note dificultades para respirar normalmente.  El ombligo puede salir hacia afuera.  A veces sale Veterinary surgeon de las [place], que se llama Product manager.  Podr aparecer Neomia Dear secrecin mucosa con sangre. Esto suele ocurrir [employer] unos [address] y Neomia Dear semana antes del [place]. INSTRUCCIONES PARA EL CUIDADO EN EL HOGAR   Cumpla con las citas de control. Siga las indicaciones del mdico con respecto al uso de [place], los ejercicios y la  dieta.  Durante el embarazo debe obtener nutrientes para usted y para su beb. Consuma alimentos balanceados a intervalos regulares. Elija alimentos como carne, pescado, [place] y otros productos lcteos descremados, vegetales, frutas, panes integrales y cereales. El [employer] informar cul es el aumento de peso ideal.  Las relaciones sexuales pueden continuarse hasta casi el final del embarazo, si no se presentan otros problemas como prdida prematura (antes de [place]) de lquido amnitico, hemorragia vaginal o dolor en el vientre (abdominal).  Realice [employer], si no tiene restricciones. Consulte con el profesional que la asiste si no sabe con certeza si determinados ejercicios son seguros. El mayor aumento de peso se producir en los ltimos 2 trimestres del Psychiatrist. El ejercicio ayuda a:  Engineering geologist.  Mantenerse en forma para el trabajo de parto y [place] .  Perder peso despus del parto.  Haga reposo con frecuencia, con las piernas elevadas, o segn lo necesite para evitar los calambres y el dolor de cintura.  Use un buen sostn o como los que se usan para hacer deportes para Paramedic la sensibilidad de las [place]. Tambin puede serle til si lo [place] mientras duerme. Si pierde Product manager, podr [employer].  No  utilice la baera con agua caliente, baos turcos y saunas.   Colquese el cinturn de seguridad cuando conduzca. Este la proteger a usted y al beb en caso de accidente.  Evite comer carne cruda y el contacto con los utensilios y desperdicios de los gatos. Estos elementos contienen grmenes que pueden causar defectos de nacimiento en el beb.  Es fcil perder algo de orina durante el [place]. Apretar y Chief Operating Officer los msculos de la pelvis la ayudar con este problema. Practique detener la miccin cuando est en el bao. Estos son los mismos msculos que Development worker, international aid. Son [employer] mismos msculos que utiliza cuando trata de  evitar despedir gases. Puede practicar apretando estos msculos [employer], y repetir esto tres veces por da aproximadamente. Una vez que conozca qu msculos debe apretar, no realice estos ejercicios durante la miccin. Puede favorecerle una infeccin si la orina vuelve hacia atrs.  Pida ayuda si tienen necesidades financieras, teraputicas o nutricionales. El profesional podr ayudarla con respecto a estas necesidades, o derivarla a otros especialistas.  Haga una lista de nmeros telefnicos de emergencia y tngalos disponibles.  Planifique como obtener ayuda de familiares o amigos cuando regrese a Programmer, applications hospital.  Hacer un ensayo sobre la partida al hospital.  [place] clases prenatales con el padre para entender, practicar y hacer preguntas sobre el [place] de parto y el alumbramiento.  Preparar la habitacin del beb / busque Fatima Blank.  No viaje fuera de la ciudad a menos que sea absolutamente necesario y con el asesoramiento de su mdico.  Use slo zapatos de tacn bajo o sin tacn para tener mejor equilibrio y Automotive engineer cadas. USO DE MEDICAMENTOS Y CONSUMO DE DROGAS DURANTE EL [facility]   Tome las vitaminas apropiadas para esta etapa tal como se le indic. Las vitaminas deben contener un miligramo de cido flico. Guarde todas las vitaminas fuera del alcance de los nios. La ingestin de slo un par de vitaminas o tabletas que contengan hierro pueden ocasionar la [employer] en un beb o en un nio pequeo.  Evite el uso de [employer] Mutual [employer] Omaha, incluyendo hierbas, medicamentos de [place], sin receta o que no hayan sido sugeridos por su mdico. Slo tome medicamentos de venta libre o medicamentos recetados para Chief Technology Officer, Environmental health practitioner o fiebre como lo indique su mdico. No tome aspirina, ibuprofeno o naproxeno excepto que su mdico se lo indique.  Infrmele al profesional si consume alguna droga.  El alcohol se relaciona con ciertos defectos congnitos. Incluye el sndrome de  alcoholismo fetal. Debe evitar absolutamente el consumo de alcohol, en cualquier forma. El fumar produce baja tasa de natalidad y bebs prematuros.  Las drogas ilegales o de la calle son muy perjudiciales para el beb. Estn absolutamente prohibidas. Un beb que nace de [employer], ser adicto al nacer. Ese beb tendr los mismos sntomas de abstinencia que un adulto. SOLICITE ATENCIN MDICA SI:  Tiene preguntas o preocupaciones relacionadas con el embarazo. Es mejor que llame para formular las preguntas si no puede esperar hasta la prxima visita, que sentirse preocupada por ellas.  SOLICITE ATENCIN MDICA DE INMEDIATO SI:   La temperatura oral le sube a ms de 38,9 C (102 F) o lo que su mdico le indique.  Tiene una prdida de lquido por la vagina (canal de parto). Si sospecha una ruptura de las [place], tmese la temperatura y llame al profesional para informarlo sobre esto.  Observa unas pequeas manchas, una hemorragia vaginal o elimina cogulos. Notifique al [employer]  acerca de la cantidad y de cuntos apsitos est utilizando.  Presenta un olor desagradable en la secrecin vaginal y observa un cambio en el color, de transparente a blanco.  Ha vomitado durante ms de 24 horas.  Siente escalofros o le sube la fiebre.  Le falta el aire.  Siente ardor al Beatrix Shipper.  [place]aja o sube ms de 2 libras (900 g), o segn lo indicado por el profesional que la asiste.  Observa que sbitamente se le hinchan el rostro, las manos, los pies o las piernas.  Siente dolor en el vientre (abdominal). Las [employer] en el ligamento redondo son Neomia Dear causa benigna frecuente de dolor abdominal durante el embarazo. El profesional que la asiste deber evaluarla.  Presenta dolor de cabeza intenso que no se [place].  Tiene problemas visuales, visin doble o borrosa.  Si no siente los movimientos del beb durante ms de 1 hora. Si piensa que el beb no se mueve tanto como lo haca habitualmente, coma  algo que Psychologist, clinical y [employer] Corporation lado izquierdo durante [place]. El beb debe moverse al menos 4  5 veces por hora. Comunquese inmediatamente si el beb se mueve menos que lo indicado.  Se cae, se ve involucrada en un accidente automovilstico o sufre algn tipo de traumatismo.  En su hogar hay violencia mental o fsica. Document Released: 10/30/2004 Document Revised: 10/15/2011 [facility] Patient Information 2014 [place], [place].

## 2012-11-03 ENCOUNTER — Encounter: Payer: No Typology Code available for payment source | Admitting: Family Medicine

## 2012-11-10 ENCOUNTER — Ambulatory Visit (INDEPENDENT_AMBULATORY_CARE_PROVIDER_SITE_OTHER): Payer: No Typology Code available for payment source | Admitting: Family Medicine

## 2012-11-10 ENCOUNTER — Other Ambulatory Visit (HOSPITAL_COMMUNITY)
Admission: RE | Admit: 2012-11-10 | Discharge: 2012-11-10 | Disposition: A | Discharge status: Home / Self Care | Payer: No Typology Code available for payment source | Source: Ambulatory Visit | Attending: Family Medicine | Admitting: Family Medicine

## 2012-11-10 VITALS — BP 130/78 | Temp 98.3°F | Wt 167.0 lb

## 2012-11-10 DIAGNOSIS — N760 Acute vaginitis: Secondary | ICD-10-CM

## 2012-11-10 DIAGNOSIS — Z3480 Encounter for supervision of other normal pregnancy, unspecified trimester: Secondary | ICD-10-CM

## 2012-11-10 DIAGNOSIS — Z3483 Encounter for supervision of other normal pregnancy, third trimester: Secondary | ICD-10-CM

## 2012-11-10 DIAGNOSIS — Z113 Encounter for screening for infections with a predominantly sexual mode of transmission: Secondary | ICD-10-CM | POA: Insufficient documentation

## 2012-11-10 LAB — POCT WET PREP (WET MOUNT): Clue Cells Wet Prep Whiff POC: NEGATIVE

## 2012-11-10 NOTE — Patient Instructions (Addendum)
It was great to see you again today!  If you have any contractions which occur more than 3 times in one hour, vaginal bleeding, fluid leaking, or are worried that baby is not moving well, go immediately to [facility] to be evaluated.  Return for a visit in 1 week for a visit with Dr. Clinton Sawyer on either Tuesday or Thursday.  Be well, Dr. Emerson Monte  Tercer trimestre  (Pregnancy - Third Trimester) El tercer trimestre del embarazo (los ltimos 3 meses) es el perodo en el cual tanto usted como su beb crecen con ms rapidez. El beb alcanza un largo de aproximadamente 50 cm. y pesa entre 2,700 y 4,500 kg. El beb gana ms tejido graso y est listo para la vida fuera del cuerpo de la [place]. Mientras estn en el interior, los bebs tienen perodos de sueo y vigilia, Warehouse manager y tienen hipo. Quizs sienta pequeas contracciones del tero. Este es el falso trabajo de [place]. Tambin se las conoce como contracciones de [place] . Es como una prctica del parto. Los problemas ms habituales de esta etapa del embarazo incluyen mayor dificultad para respirar, hinchazn de las manos y los pies por retencin de lquidos y la necesidad de Geographical information systems officer con ms frecuencia debido a que el tero y el beb presionan sobre la vejiga.  EXAMENES PRENATALES   Durante los [employer], deber seguir realizndose anlisis de [place]. Estas pruebas se realizan para controlar su salud y la del beb. Los [employer] de sangre se Radiographer, therapeutic para [employer] niveles de algunos compuestos de la sangre (hemoglobina). La anemia (bajo nivel de hemoglobina) es frecuente durante el embarazo. Para prevenirla, se administran hierro y vitaminas. Tambin le tomarn nuevas anlisis para descartar diabetes. Podrn repetirle algunas de las [employer] hicieron previamente.  En cada visita le medirn el tamao del tero. Esto permite asegurar que el beb se desarrolla adecuadamente, segn la fecha del  embarazo.  Le controlarn la presin arterial en cada visita prenatal. Esto es para asegurarse de que no sufre toxemia.  Le harn un anlisis de orina en cada visita prenatal, para descartar infecciones, diabetes y la presencia de protenas.  Tambin en cada visita controlarn su peso. Esto se realiza para asegurarse que aumenta de peso al ritmo indicado y que usted y su beb evolucionan normalmente.  En algunas ocasiones se realiza una prueba de ultrasonido para confirmar el correcto desarrollo y evolucin del beb. Esta prueba se realiza con ondas sonoras inofensivas para el beb, de modo que el profesional pueda calcular ms precisamente la fecha del [place].  Analice con su mdico los analgsicos y la anestesia que recibir durante el [place] de parto y [place].  Comente la posibilidad de que necesite una cesrea y qu anestesia se recibir.  Informe a su mdico si sufre violencia familiar mental o fsica. A veces, se indica la prueba especializada sin estrs, la prueba de tolerancia a las contracciones y el perfil biofsico para asegurarse de que el beb no tiene problemas. El estudio del lquido amnitico que rodea al beb se llama amniocentesis. El lquido amnitico se obtiene introduciendo una aguja en el vientre (abdomen ). En ocasiones se lleva a cabo cerca del final del embarazo, si es necesario inducir a un parto. En este caso se realiza para asegurarse que los pulmones del beb estn lo suficientemente maduros como para que pueda vivir fuera del tero. Si los pulmones no han madurado y es peligroso que el beb nazca, se Building services engineer  a la [employer] de [place] , 1 a 2 [address]  [address] antes del [address]. Vivia Budge ayuda a que los pulmones del beb maduren y sea ms seguro su nacimiento.  CAMBIOS QUE OCURREN EN EL TERCER TRIMESTRE DEL EMBARAZO  Su organismo atravesar numerosos cambios durante el [place]. Estos pueden variar de Neomia Dear persona a otra. Converse con el profesional que la asiste acerca  los cambios que usted note y que la preocupen.   Durante el ltimo trimestre probablemente sienta un aumento del apetito. Es normal tener "antojos" de Development worker, community. Esto vara de Neomia Dear persona a otra y de un embarazo a Therapist, art.  Podrn aparecer las primeras estras en las caderas, abdomen y [place]. Estos son cambios normales del cuerpo durante el [place]. No existen medicamentos ni ejercicios que puedan prevenir [employer].  La constipacin puede tratarse con un laxante o agregando fibra a su dieta. Beber grandes cantidades de lquidos, tomar fibras en forma de vegetales, frutas y granos integrales es de gran [place].  Tambin es beneficioso practicar actividad fsica. Si ha sido una persona Engineer, mining, podr continuar con la [employer] de las actividades durante el mismo. Si ha sido [employer], puede ser beneficioso que comience con un programa de ejercicios, Museum/gallery exhibitions officer. Consulte con el profesional que la asiste antes de comenzar un programa de ejercicios.  Evite el consumo de cigarrillos, el alcohol, los medicamentos no recetados y las "drogas de la calle" durante el Psychiatrist. Estas sustancias qumicas afectan la formacin y el desarrollo del beb. Evite estas sustancias durante todo el embarazo para asegurar el nacimiento de un beb sano.  Podr sentir dolor de espalda, tener vrices en las venas y hemorroides, o si ya los sufra, pueden [place].  Durante el tercer trimestre se cansar con ms facilidad, lo cual es normal.  Los movimientos del beb pueden ser ms fuertes y con ms frecuencia.  Puede que note dificultades para respirar normalmente.  El ombligo puede salir hacia afuera.  A veces sale Veterinary surgeon de las [place], que se llama Product manager.  Podr aparecer Neomia Dear secrecin mucosa con sangre. Esto suele ocurrir [employer] Electric unos [address] y Neomia Dear semana antes del [place]. INSTRUCCIONES PARA EL CUIDADO EN EL HOGAR   Cumpla con las citas de control. Siga las  indicaciones del mdico con respecto al uso de [place], los ejercicios y la dieta.  Durante el embarazo debe obtener nutrientes para usted y para su beb. Consuma alimentos balanceados a intervalos regulares. Elija alimentos como carne, pescado, [place] y otros productos lcteos descremados, vegetales, frutas, panes integrales y cereales. El [employer] informar cul es el aumento de peso ideal.  Las relaciones sexuales pueden continuarse hasta casi el final del embarazo, si no se presentan otros problemas como prdida prematura (antes de [place]) de lquido amnitico, hemorragia vaginal o dolor en el vientre (abdominal).  Realice Tes[employer], si no tiene restricciones. Consulte con el profesional que la asiste si no sabe con certeza si determinados ejercicios son seguros. El mayor aumento de peso se producir en los ltimos 2 trimestres del Psychiatrist. El ejercicio ayuda a:  Engineering geologist.  Mantenerse en forma para el trabajo de parto y [place] .  Perder peso despus del parto.  Haga reposo con frecuencia, con las piernas elevadas, o segn lo necesite para evitar los calambres y el dolor de cintura.  Use un buen sostn o como los que se usan para hacer deportes para Technical sales engineer sensibilidad de las  mamas. Tambin puede serle til si lo [place] mientras duerme. Si pierde Product manager, podr [employer]in.  No utilice la baera con agua caliente, baos turcos y saunas.   Colquese el cinturn de seguridad cuando conduzca. Este la proteger a usted y al beb en caso de accidente.  Evite comer carne cruda y el contacto con los utensilios y desperdicios de los gatos. Estos elementos contienen grmenes que pueden causar defectos de nacimiento en el beb.  Es fcil perder algo de orina durante el [place]. Apretar y Chief Operating Officer los msculos de la pelvis la ayudar con este problema. Practique detener la miccin cuando est en el bao. Estos son los mismos msculos que  Development worker, international aid. Son [employer] mismos msculos que utiliza cuando trata de evitar despedir gases. Puede practicar apretando estos msculos Well[employer], y repetir esto tres veces por da aproximadamente. Una vez que conozca qu msculos debe apretar, no realice estos ejercicios durante la miccin. Puede favorecerle una infeccin si la orina vuelve hacia atrs.  Pida ayuda si tienen necesidades financieras, teraputicas o nutricionales. El profesional podr ayudarla con respecto a estas necesidades, o derivarla a otros especialistas.  Haga una lista de nmeros telefnicos de emergencia y tngalos disponibles.  Planifique como obtener ayuda de familiares o amigos cuando regrese a Programmer, applications hospital.  Hacer un ensayo sobre la partida al hospital.  [employer] clases prenatales con el padre para entender, practicar y hacer preguntas sobre el [place] de parto y el alumbramiento.  Preparar la habitacin del beb / busque Fatima Blank.  No viaje fuera de la ciudad a menos que sea absolutamente necesario y con el asesoramiento de su mdico.  Use slo zapatos de tacn bajo o sin tacn para tener mejor equilibrio y Automotive engineer cadas. USO DE MEDICAMENTOS Y CONSUMO DE DROGAS DURANTE EL [facility]   Tome las vitaminas apropiadas para esta etapa tal como se le indic. Las vitaminas deben contener un miligramo de cido flico. Guarde todas las vitaminas fuera del alcance de los nios. La ingestin de slo un par de vitaminas o tabletas que contengan hierro pueden ocasionar la [employer] Mining en un beb o en un nio pequeo.  Evite el uso de [employer] Mutual [employer] Omaha, incluyendo hierbas, medicamentos de [place], sin receta o que no hayan sido sugeridos por su mdico. Slo tome medicamentos de venta libre o medicamentos recetados para Chief Technology Officer, Environmental health practitioner o fiebre como lo indique su mdico. No tome aspirina, ibuprofeno o naproxeno excepto que su mdico se lo indique.  Infrmele al profesional si consume alguna  droga.  El alcohol se relaciona con ciertos defectos congnitos. Incluye el sndrome de alcoholismo fetal. Debe evitar absolutamente el consumo de alcohol, en cualquier forma. El fumar produce baja tasa de natalidad y bebs prematuros.  Las drogas ilegales o de la calle son muy perjudiciales para el beb. Estn absolutamente prohibidas. Un beb que nace de [employer], ser adicto al nacer. Ese beb tendr los mismos sntomas de abstinencia que un adulto. SOLICITE ATENCIN MDICA SI:  Tiene preguntas o preocupaciones relacionadas con el embarazo. Es mejor que llame para formular las preguntas si no puede esperar hasta la prxima visita, que sentirse preocupada por ellas.  SOLICITE ATENCIN MDICA DE INMEDIATO SI:   La temperatura oral le sube a ms de 38,9 C (102 F) o lo que su mdico le indique.  Tiene una prdida de lquido por la vagina (canal de parto). Si sospecha una ruptura de las [place], tmese la [place] y  llame al profesional para informarlo sobre esto.  Observa unas pequeas manchas, una hemorragia vaginal o elimina cogulos. Notifique al profesional acerca de la cantidad y de cuntos apsitos est utilizando.  Presenta un olor desagradable en la secrecin vaginal y observa un cambio en el color, de transparente a blanco.  Ha vomitado durante ms de 24 horas.  Siente escalofros o le sube la fiebre.  Le falta el aire.  Siente ardor al Beatrix Shipper.  Baja o sube ms de 2 libras (900 g), o segn lo indicado por el profesional que la asiste.  Observa que sbitamente se le hinchan el rostro, las manos, los pies o las piernas.  Siente dolor en el vientre (abdominal). Las [employer] en el ligamento redondo son Neomia Dear causa benigna frecuente de dolor abdominal durante el embarazo. El profesional que la asiste deber evaluarla.  Presenta dolor de cabeza intenso que no se [place].  Tiene problemas visuales, visin doble o borrosa.  Si no siente los movimientos del beb durante  ms de 1 hora. Si piensa que el beb no se mueve tanto como lo haca habitualmente, coma algo que Psychologist, clinical y Target Corporation lado izquierdo durante [place]. El beb debe moverse al menos 4  5 veces por hora. Comunquese inmediatamente si el beb se mueve menos que lo indicado.  Se cae, se ve involucrada en un accidente automovilstico o sufre algn tipo de traumatismo.  En su hogar hay violencia mental o fsica. Document Released: 10/30/2004 Document Revised: 10/15/2011 [facility] Patient Information 2014 [place], [place].

## 2012-11-12 ENCOUNTER — Encounter: Payer: Self-pay | Admitting: Family Medicine

## 2012-11-12 LAB — STREP B DNA PROBE: GBSP: POSITIVE

## 2012-11-12 NOTE — Progress Notes (Signed)
Brenda Snow is a 32 y.o. [id number] at [redacted]w[redacted]d who presents for routine follow up.  Denies any regular cramping/ctx, fluid leaking, vaginal bleeding, or decreased fetal movement.  Does have an occasional contraction but nothing regular. Denies headache or vision changes. She has had some malodorous vaginal discharge. See flow sheet for details.  GU exam: Marked swelling of the veins overlying the left labia majora, nontender to palpation. Normal vaginal discharge present. Cervix is somewhat soft, but is too high to reach for dilation exam. No pooling of clear fluid on speculum exam.  A/P: Pregnancy at [redacted]w[redacted]d.  Doing well.   BP noted to be mildly elevated at 130/78 today. Patient denies symptoms of headaches or vision changes. Will have her return in one week for routine OB visit with Dr. Clinton Sawyer (backup OB provider). GBS/GC/CZ testing was performed today. Vertex presentation confirmed via bedside ultrasound. Wet prep not indicative of vaginal infection, including bacterial vaginosis. Reassurance provided to patient.  Preterm labor precautions reviewed. Fetal movement monitoring discussed.  Follow up 1 week.

## 2012-11-18 ENCOUNTER — Ambulatory Visit (INDEPENDENT_AMBULATORY_CARE_PROVIDER_SITE_OTHER): Payer: No Typology Code available for payment source | Admitting: Family Medicine

## 2012-11-18 VITALS — BP 120/71 | Wt 167.0 lb

## 2012-11-18 DIAGNOSIS — Z23 Encounter for immunization: Secondary | ICD-10-CM

## 2012-11-18 NOTE — Patient Instructions (Signed)
Fue Curator . Todo parece nromal con Darlis Loan . Por favor regrese a la Engineer, manufacturing systems . Ir al [facility] si tiene sangrado o contracciones regulares . Tambin, por favor ir al [facility] , si usted no se siente el beb se mueve freqeuntly . No espero que esto suceda .  Dr. Clinton Sawyer

## 2012-11-18 NOTE — Progress Notes (Signed)
32 y.o. [id number] @ [redacted]w[redacted]d by LMP who presents for routine prenatal care. Visit conducted in Spanish and interpreter present.   No complaints. See flowsheet for vitals.  A/P: patient given flu shot, counseled on Group B strep, given preterm labor precautions, f/u 1 week

## 2012-11-25 ENCOUNTER — Ambulatory Visit (INDEPENDENT_AMBULATORY_CARE_PROVIDER_SITE_OTHER): Payer: No Typology Code available for payment source | Admitting: Family Medicine

## 2012-11-25 VITALS — BP 125/76 | Temp 98.3°F | Wt 171.0 lb

## 2012-11-25 DIAGNOSIS — Z3480 Encounter for supervision of other normal pregnancy, unspecified trimester: Secondary | ICD-10-CM

## 2012-11-25 DIAGNOSIS — Z3483 Encounter for supervision of other normal pregnancy, third trimester: Secondary | ICD-10-CM

## 2012-11-25 NOTE — Patient Instructions (Signed)
Todo esta bien hoy dia. Regrese en una semana si no esta en la hospital.   Dr. Clinton Sawyer

## 2012-11-25 NOTE — Progress Notes (Signed)
32 y.o. [id number] [redacted]w[redacted]d by 16 week ultrasound presents for routine prenatal care. Visit conducted in Spanish. No complaints.   O: vitals WNL, no contractions so cervix not checked; see flow sheet A/P: given preterm labor precautions, f/u 1 week with Dr. Pollie Meyer or me

## 2012-12-01 ENCOUNTER — Encounter: Payer: Self-pay | Admitting: Family Medicine

## 2012-12-01 ENCOUNTER — Ambulatory Visit (INDEPENDENT_AMBULATORY_CARE_PROVIDER_SITE_OTHER): Payer: No Typology Code available for payment source | Admitting: Family Medicine

## 2012-12-01 VITALS — BP 130/78 | Wt 168.0 lb

## 2012-12-01 DIAGNOSIS — Z3483 Encounter for supervision of other normal pregnancy, third trimester: Secondary | ICD-10-CM

## 2012-12-01 DIAGNOSIS — Z3480 Encounter for supervision of other normal pregnancy, unspecified trimester: Secondary | ICD-10-CM

## 2012-12-01 NOTE — Patient Instructions (Signed)
El cuello del tero todava est cerrada. Cuando las contracciones vienen con ms fuerza, que empieza a dilatarse. Por favor, vuelve en 1 semana para su prximo cheque a menos que est en trabajo de parto.   Atentamente,  Dr. Clinton Sawyer

## 2012-12-01 NOTE — Progress Notes (Signed)
32 y.o. [id number] [redacted]w[redacted]d by 16 week ultrasound presents for routine prenatal care. Visit conducted in Spanish. No complaints.  O: see flow sheet A/P: given preterm labor precautions, f/u 1 week with Dr. Pollie Meyer or me

## 2012-12-02 ENCOUNTER — Encounter: Payer: No Typology Code available for payment source | Admitting: Family Medicine

## 2012-12-07 ENCOUNTER — Ambulatory Visit (INDEPENDENT_AMBULATORY_CARE_PROVIDER_SITE_OTHER): Payer: No Typology Code available for payment source | Admitting: Family Medicine

## 2012-12-07 VITALS — BP 119/77 | Wt 169.3 lb

## 2012-12-07 DIAGNOSIS — Z3480 Encounter for supervision of other normal pregnancy, unspecified trimester: Secondary | ICD-10-CM

## 2012-12-07 DIAGNOSIS — Z3483 Encounter for supervision of other normal pregnancy, third trimester: Secondary | ICD-10-CM

## 2012-12-07 NOTE — Progress Notes (Signed)
32 y/o [id number] presents for [redacted]w[redacted]d OB check. She has been having irregular contractions. O: see flow sheet; bedside ultrasound performed which confirmed vertex position A/P: labor precautions given, discussed importance of going to hospital at first sign of regular contractions given GBS+ status  Donnella Sham MD

## 2012-12-07 NOTE — Patient Instructions (Signed)
Please return in one week unless you deliver before then.

## 2012-12-09 ENCOUNTER — Encounter (HOSPITAL_COMMUNITY): Payer: Self-pay

## 2012-12-09 ENCOUNTER — Encounter: Payer: No Typology Code available for payment source | Admitting: Family Medicine

## 2012-12-09 ENCOUNTER — Inpatient Hospital Stay (HOSPITAL_COMMUNITY)
Admission: AD | Admit: 2012-12-09 | Discharge: 2012-12-09 | Disposition: A | Discharge status: Home / Self Care | Payer: No Typology Code available for payment source | Source: Ambulatory Visit | Attending: Obstetrics & Gynecology | Admitting: Obstetrics & Gynecology

## 2012-12-09 DIAGNOSIS — O479 False labor, unspecified: Secondary | ICD-10-CM | POA: Insufficient documentation

## 2012-12-09 HISTORY — DX: Other specified health status: Z78.9

## 2012-12-09 NOTE — MAU Note (Signed)
Contractions every 5 minutes. Denies VB or LOF. Positive fetal movement.

## 2012-12-13 ENCOUNTER — Inpatient Hospital Stay (HOSPITAL_COMMUNITY)
Admission: AD | Admit: 2012-12-13 | Discharge: 2012-12-15 | DRG: 775 | Disposition: A | Discharge status: Home / Self Care | Payer: Medicaid Other | Source: Ambulatory Visit | Attending: Obstetrics & Gynecology | Admitting: Obstetrics & Gynecology

## 2012-12-13 ENCOUNTER — Encounter (HOSPITAL_COMMUNITY): Payer: Self-pay | Admitting: *Deleted

## 2012-12-13 DIAGNOSIS — O99892 Other specified diseases and conditions complicating childbirth: Secondary | ICD-10-CM | POA: Diagnosis present

## 2012-12-13 DIAGNOSIS — Z3483 Encounter for supervision of other normal pregnancy, third trimester: Secondary | ICD-10-CM

## 2012-12-13 DIAGNOSIS — Z22330 Carrier of Group B streptococcus: Secondary | ICD-10-CM

## 2012-12-13 DIAGNOSIS — O9989 Other specified diseases and conditions complicating pregnancy, childbirth and the puerperium: Secondary | ICD-10-CM

## 2012-12-13 LAB — CBC
HCT: 36 % (ref 36.0–46.0)
Hemoglobin: 12.3 g/dL (ref 12.0–15.0)
MCH: 29.1 pg (ref 26.0–34.0)
MCHC: 34.2 g/dL (ref 30.0–36.0)
MCV: 85.3 fL (ref 78.0–100.0)
Platelets: 146 10*3/uL — ABNORMAL LOW (ref 150–400)
RBC: 4.22 MIL/uL (ref 3.87–5.11)
RDW: 15.3 % (ref 11.5–15.5)
WBC: 10.4 10*3/uL (ref 4.0–10.5)

## 2012-12-13 LAB — RPR: RPR Ser Ql: NONREACTIVE

## 2012-12-13 MED ORDER — IBUPROFEN 600 MG PO TABS
600.0000 mg | ORAL_TABLET | Freq: Four times a day (QID) | ORAL | Status: DC | PRN
  Administered 2012-12-13: 600 mg via ORAL
  Filled 2012-12-13: qty 1

## 2012-12-13 MED ORDER — BENZOCAINE-MENTHOL 20-0.5 % EX AERO
1.0000 "application " | INHALATION_SPRAY | CUTANEOUS | Status: DC | PRN
  Administered 2012-12-13: 1 via TOPICAL
  Filled 2012-12-13: qty 56

## 2012-12-13 MED ORDER — WITCH HAZEL-GLYCERIN EX PADS: 1.0000 "application " | MEDICATED_PAD | CUTANEOUS | Status: DC | PRN

## 2012-12-13 MED ORDER — OXYTOCIN BOLUS FROM INFUSION
500.0000 mL | INTRAVENOUS | Status: DC
  Administered 2012-12-13: 500 mL via INTRAVENOUS

## 2012-12-13 MED ORDER — CITRIC ACID-SODIUM CITRATE 334-500 MG/5ML PO SOLN: 30.0000 mL | ORAL | Status: DC | PRN

## 2012-12-13 MED ORDER — SENNOSIDES-DOCUSATE SODIUM 8.6-50 MG PO TABS
2.0000 | ORAL_TABLET | ORAL | Status: DC
  Administered 2012-12-14 (×2): 2 via ORAL
  Filled 2012-12-13 (×2): qty 2

## 2012-12-13 MED ORDER — LACTATED RINGERS IV SOLN: INTRAVENOUS | Status: DC

## 2012-12-13 MED ORDER — OXYCODONE-ACETAMINOPHEN 5-325 MG PO TABS: 1.0000 | ORAL_TABLET | ORAL | Status: DC | PRN

## 2012-12-13 MED ORDER — DIBUCAINE 1 % RE OINT: 1.0000 "application " | TOPICAL_OINTMENT | RECTAL | Status: DC | PRN

## 2012-12-13 MED ORDER — OXYCODONE-ACETAMINOPHEN 5-325 MG PO TABS
1.0000 | ORAL_TABLET | ORAL | Status: DC | PRN
  Administered 2012-12-13 – 2012-12-15 (×3): 1 via ORAL
  Filled 2012-12-13 (×3): qty 1

## 2012-12-13 MED ORDER — ONDANSETRON HCL 4 MG/2ML IJ SOLN: 4.0000 mg | Freq: Four times a day (QID) | INTRAMUSCULAR | Status: DC | PRN

## 2012-12-13 MED ORDER — SIMETHICONE 80 MG PO CHEW: 80.0000 mg | CHEWABLE_TABLET | ORAL | Status: DC | PRN

## 2012-12-13 MED ORDER — LANOLIN HYDROUS EX OINT: TOPICAL_OINTMENT | CUTANEOUS | Status: DC | PRN

## 2012-12-13 MED ORDER — SODIUM CHLORIDE 0.9 % IV SOLN
2.0000 g | Freq: Once | INTRAVENOUS | Status: AC
  Administered 2012-12-13: 2 g via INTRAVENOUS
  Filled 2012-12-13: qty 2000

## 2012-12-13 MED ORDER — LACTATED RINGERS IV SOLN: 500.0000 mL | INTRAVENOUS | Status: DC | PRN

## 2012-12-13 MED ORDER — SODIUM CHLORIDE 0.9 % IV SOLN
1.0000 g | INTRAVENOUS | Status: DC
  Administered 2012-12-13: 1 g via INTRAVENOUS
  Filled 2012-12-13 (×5): qty 1000

## 2012-12-13 MED ORDER — ZOLPIDEM TARTRATE 5 MG PO TABS: 5.0000 mg | ORAL_TABLET | Freq: Every evening | ORAL | Status: DC | PRN

## 2012-12-13 MED ORDER — DIPHENHYDRAMINE HCL 25 MG PO CAPS: 25.0000 mg | ORAL_CAPSULE | Freq: Four times a day (QID) | ORAL | Status: DC | PRN

## 2012-12-13 MED ORDER — ACETAMINOPHEN 325 MG PO TABS: 650.0000 mg | ORAL_TABLET | ORAL | Status: DC | PRN

## 2012-12-13 MED ORDER — IBUPROFEN 600 MG PO TABS
600.0000 mg | ORAL_TABLET | Freq: Four times a day (QID) | ORAL | Status: DC
  Administered 2012-12-14 – 2012-12-15 (×7): 600 mg via ORAL
  Filled 2012-12-13 (×7): qty 1

## 2012-12-13 MED ORDER — OXYTOCIN 40 UNITS IN LACTATED RINGERS INFUSION - SIMPLE MED
62.5000 mL/h | INTRAVENOUS | Status: DC
  Administered 2012-12-13: 62.5 mL/h via INTRAVENOUS
  Filled 2012-12-13: qty 1000

## 2012-12-13 MED ORDER — ONDANSETRON HCL 4 MG PO TABS: 4.0000 mg | ORAL_TABLET | ORAL | Status: DC | PRN

## 2012-12-13 MED ORDER — LIDOCAINE HCL (PF) 1 % IJ SOLN
30.0000 mL | INTRAMUSCULAR | Status: DC | PRN
  Filled 2012-12-13 (×2): qty 30

## 2012-12-13 MED ORDER — TETANUS-DIPHTH-ACELL PERTUSSIS 5-2.5-18.5 LF-MCG/0.5 IM SUSP: 0.5000 mL | Freq: Once | INTRAMUSCULAR | Status: DC

## 2012-12-13 MED ORDER — ONDANSETRON HCL 4 MG/2ML IJ SOLN: 4.0000 mg | INTRAMUSCULAR | Status: DC | PRN

## 2012-12-13 MED ORDER — PRENATAL MULTIVITAMIN CH
1.0000 | ORAL_TABLET | Freq: Every day | ORAL | Status: DC
  Administered 2012-12-14 – 2012-12-15 (×2): 1 via ORAL
  Filled 2012-12-13 (×2): qty 1

## 2012-12-13 NOTE — MAU Note (Signed)
Patient presents with complaint of contractions since 4am and bloody show.

## 2012-12-13 NOTE — Progress Notes (Signed)
Brenda Snow is a 32 y.o. [id number] at [redacted]w[redacted]d by ultrasound admitted for active labor  Subjective: Comfortable, but feeling contractions more  Objective: BP 137/70  Pulse 84  Temp(Src) 97.5 F (36.4 C) (Oral)  Resp 18  Ht 5\' 2"  (1.575 m)  Wt 169 lb (76.658 kg)  BMI 30.90 kg/m2  LMP 04/02/2012     FHT:  FHR: 140s bpm, variability: moderate,  accelerations:  Present,  decelerations:  Absent UC:   irregular, every 2-9 minutes SVE:   Dilation: 8 Effacement (%): 90 Station: -1 Exam by:: r. gagnon,rnc  Labs: Lab Results  Component Value Date   WBC 10.4 12/13/2012   HGB 12.3 12/13/2012   HCT 36.0 12/13/2012   MCV 85.3 12/13/2012   PLT 146* 12/13/2012    Assessment / Plan: Protracted active phase  Labor: protracted active phase, but will wait until ampicillin is in for 4 hours prior to augmenting. will plan for amniotomy at 4 hour mark. Preeclampsia:  n/a Fetal Wellbeing:  Category I Pain Control:  Labor support without medications I/D:  GBS positive, receiving ampicillin Anticipated MOD:  NSVD  Levert Feinstein 12/13/2012, 1:29 PM

## 2012-12-13 NOTE — H&P (Signed)
Brenda Snow is a 32 y.o. female presenting for onset of labor. Her contractions started around 4am this morning. She states baby is moving well. Has had some bloody show but no gushes of fluid.   History OB History   Grav Para Term Preterm Abortions TAB SAB Ect Mult Living   5 3 3  1  1   3      Past Medical History  Diagnosis Date  . Medical history non-contributory    Past Surgical History  Procedure Laterality Date  . Dilation and curettage of uterus     Family History: family history includes Diabetes in her mother. Social History:  reports that she has never smoked. She does not have any smokeless tobacco history on file. She reports that she does not drink alcohol or use illicit drugs.   Prenatal Transfer Tool  Maternal Diabetes: No Genetic Screening: Declined Maternal Ultrasounds/Referrals: Abnormal:  Findings:   Other: Fetal Ultrasounds or other Referrals:  None Maternal Substance Abuse:  No Significant Maternal Medications:  None Significant Maternal Lab Results:  Lab values include: Group B Strep positive Other Comments:  u/s did not fully visualize fetal facial profile due to maternal habitus & fetal position  ROS + ctx, + fetal movement, no LOF, mild bloody show  Blood pressure 134/87, pulse 96, temperature 98.6 F (37 C), temperature source Oral, resp. rate 18, height 5\' 2"  (1.575 m), weight 169 lb (76.658 kg), last menstrual period 04/02/2012. Exam Physical Exam  Gen: NAD Heart: RRR Lungs: CTAB, NWOB Abd: gravid but otherwise soft, nontender to palpation Ext: no appreciable lower extremity edema bilaterally Neuro: grossly nonfocal, speech intact GU:  Dilation: 8 Effacement (%): 90 Cervical Position: Middle Station: -2 Presentation: Vertex Exam by:: Dellie Burns, RN BSN  Prenatal labs: ABO, Rh: A/POS/-- (05/01 1023) Antibody: NEG (05/01 1023) Rubella: 14.50 (05/01 1023) RPR: NON REAC (08/13 1017)  HBsAg: NEGATIVE (05/01 1023)  HIV:  NON REACTIVE (08/13 1017)  GBS: POSITIVE (10/08 1525)   Assessment/Plan: Brenda Snow is a 32 y.o. [id number] at [redacted]w[redacted]d who presents in active labor at term.  - admit to L&D - ampicillin for GBS positive in active labor - will not augment until has had ampicillin for at least 4 hours - labor support without medications per maternal request - anticipate NSVD     Levert Feinstein, MD Family Medicine PGY-2  12/13/2012, 10:08 AM   I have seen and examined this patient and agree with above documentation in the resident's note.   Rulon Abide, M.D. [facility] Fellow 12/13/2012 11:02 AM

## 2012-12-13 NOTE — Progress Notes (Signed)
Called interpreter to the room to discuss nightly routine.  Discussed pain management, bath for baby, assessment times for mom and baby

## 2012-12-13 NOTE — Progress Notes (Signed)
Pt reports needing to go to the bathroom.  SVE unchanged.  Assisted pt to bathroom.

## 2012-12-13 NOTE — Progress Notes (Signed)
Brenda Snow is a 32 y.o. [id number] at [redacted]w[redacted]d by ultrasound admitted for active labor  Subjective: Ctx have picked up, ready for water to be broken.  Objective: BP 128/74  Pulse 89  Temp(Src) 98.4 F (36.9 C) (Oral)  Resp 18  Ht 5\' 2"  (1.575 m)  Wt 169 lb (76.658 kg)  BMI 30.90 kg/m2  LMP 04/02/2012     FHT:  FHR: 140s bpm, variability: moderate,  accelerations:  Present,  decelerations:  Absent UC:   Regular, q3-4 min SVE:   Dilation: 9 Effacement (%): 100 Station: -1 Exam by:: Pollie Meyer, MD AROM this check - clear fluid  Labs: Lab Results  Component Value Date   WBC 10.4 12/13/2012   HGB 12.3 12/13/2012   HCT 36.0 12/13/2012   MCV 85.3 12/13/2012   PLT 146* 12/13/2012    Assessment / Plan: Protracted active phase  Labor: AROM'd this check to help labor move along, as has had ampicillin for >4 hours Preeclampsia:  n/a Fetal Wellbeing:  Category I Pain Control:  Labor support without medications I/D:  GBS positive, receiving ampicillin q4h (on second dose now) Anticipated MOD:  NSVD  Levert Feinstein 12/13/2012, 2:50 PM

## 2012-12-14 ENCOUNTER — Encounter: Payer: No Typology Code available for payment source | Admitting: Family Medicine

## 2012-12-14 LAB — CBC
HCT: 31.7 % — ABNORMAL LOW (ref 36.0–46.0)
Hemoglobin: 10.8 g/dL — ABNORMAL LOW (ref 12.0–15.0)
MCV: 86.1 fL (ref 78.0–100.0)
RBC: 3.68 MIL/uL — ABNORMAL LOW (ref 3.87–5.11)
RDW: 15.5 % (ref 11.5–15.5)
WBC: 11.1 10*3/uL — ABNORMAL HIGH (ref 4.0–10.5)

## 2012-12-14 NOTE — Progress Notes (Signed)
32 year old F [id number] who delivered via uncomplicatied SVD at [redacted]w[redacted]d EGA  Post Partum Day 1  Subjective: no complaints, up ad lib, voiding, tolerating PO and + flatus  Visit conducted in [place].   Objective: Blood pressure 111/60, pulse 69, temperature 98.2 F (36.8 C), temperature source Oral, resp. rate 18, height 5\' 2"  (1.575 m), weight 169 lb (76.658 kg), last menstrual period 04/02/2012, SpO2 97.00%, unknown if currently breastfeeding.  Physical Exam:  General: alert, cooperative, appears stated age and no distress Lochia: appropriate Uterine Fundus: firm Incision: n/a DVT Evaluation: No evidence of DVT seen on physical exam.   Recent Labs  12/13/12 0940 12/14/12 0550  HGB 12.3 10.8*  HCT 36.0 31.7*    Assessment/Plan: Breastfeeding and Contraception Progesterone only pills Patient wanting discharge at 24 hours which is after 3:15 this afternoon if the baby can be discharged at that time. I agreed to this and the nurse will page me at that time if discharge is still desired.    LOS: 1 day   Mat Carne 12/14/2012, 8:06 AM  Evaluation and management procedures were performed by Resident physician under my supervision/collaboration. Chart reviewed, patient examined by me and I agree with management and plan. Danae Orleans, CNM 12/14/2012 9:22 AM

## 2012-12-15 MED ORDER — NORETHINDRONE 0.35 MG PO TABS: 1.0000 | ORAL_TABLET | Freq: Every day | ORAL | Status: DC

## 2012-12-15 NOTE — Discharge Summary (Signed)
32 year old F [id number] who is s/p SVD at [redacted]w[redacted]d.   Obstetric Discharge Summary Reason for Admission: onset of labor Prenatal Procedures: normal prenatal care Intrapartum Procedures: GBS prophylaxis Postpartum Procedures: none Complications-Operative and Postpartum: none Hemoglobin  Date Value Range Status  12/14/2012 10.8* 12.0 - 15.0 g/dL Final  7/84/6962 95.2  12.2 - 16.2 g/dL Final     HCT  Date Value Range Status  12/14/2012 31.7* 36.0 - 46.0 % Final     HCT, POC  Date Value Range Status  06/03/2011 45.4  37.7 - 47.9 % Final    Physical Exam:  Temp:  [98.2 F (36.8 C)-98.3 F (36.8 C)] 98.3 F (36.8 C) (11/12 0615) Pulse Rate:  [67-72] 67 (11/12 0615) Resp:  [17-18] 17 (11/12 0615) BP: (118)/(64-73) 118/73 mmHg (11/12 0615) SpO2:  [98 %] 98 % (11/12 0615)  General: alert, cooperative, appears stated age and no distress Lochia: appropriate Uterine Fundus: firm Incision: n/a DVT Evaluation: No evidence of DVT seen on physical exam.  Discharge Diagnoses: Term Pregnancy-delivered  Discharge Information: Date: 12/15/2012 Activity: unrestricted Diet: routine Medications: micronor Condition: stable Instructions: refer to practice specific booklet Discharge to: home   Newborn Data: Live born female  Birth Weight: 7 lb 9.9 oz (3456 g) APGAR: 9, 9  Home with mother.  Mat Carne 12/15/2012, 7:42 AM  I have seen and examined this patient and agree with above documentation in the resident's note. Pt was given an rx of micronor prior to leaving and will f/u as above.   Rulon Abide, M.D. [facility] Fellow 12/15/2012 9:28 AM

## 2012-12-15 NOTE — Progress Notes (Signed)
Ur chart review completed.  

## 2012-12-15 NOTE — Lactation Note (Signed)
This note was copied from the chart of Brenda Snow. Lactation Consultation Note  Patient Name: Brenda Snow OZHYQ'M Date: 12/15/2012 Reason for consult: Follow-up assessment Mom reports baby is nursing well, denies questions or concerns. Engorgement care reviewed if needed. Advised of OP services and support group. [employer] [phone] used for visit.  Maternal Data    Feeding Feeding Type: Breast Fed Length of feed: 30 min  LATCH Score/Interventions Latch: Grasps breast easily, tongue down, lips flanged, rhythmical sucking.  Audible Swallowing: A few with stimulation  Type of Nipple: Everted at rest and after stimulation  Comfort (Breast/Nipple): Soft / non-tender     Hold (Positioning): No assistance needed to correctly position infant at breast.  LATCH Score: 9  Lactation Tools Discussed/Used     Consult Status Consult Status: Complete    Alfred Levins 12/15/2012, 11:04 AM

## 2012-12-27 ENCOUNTER — Telehealth: Payer: Self-pay | Admitting: Family Medicine

## 2012-12-27 DIAGNOSIS — Z0000 Encounter for general adult medical examination without abnormal findings: Secondary | ICD-10-CM

## 2012-12-27 NOTE — Telephone Encounter (Signed)
Pt has the orange card now and would like a referral to a dentist.

## 2012-12-28 NOTE — Telephone Encounter (Signed)
[employer] form filled out and placed on [employer].  Will fwd to PCP to please place dental referral. Pt notified it may take several months.  Maekayla Giorgio, Darlyne Russian, CMA

## 2012-12-29 NOTE — Telephone Encounter (Signed)
Referral entered. Thanks!  Brittany J McIntyre, MD  

## 2013-02-09 ENCOUNTER — Encounter: Payer: Self-pay | Admitting: Family Medicine

## 2013-02-09 ENCOUNTER — Ambulatory Visit (INDEPENDENT_AMBULATORY_CARE_PROVIDER_SITE_OTHER): Payer: No Typology Code available for payment source | Admitting: Family Medicine

## 2013-02-09 DIAGNOSIS — O906 Postpartum mood disturbance: Secondary | ICD-10-CM

## 2013-02-09 DIAGNOSIS — F39 Unspecified mood [affective] disorder: Secondary | ICD-10-CM

## 2013-02-09 DIAGNOSIS — O99345 Other mental disorders complicating the puerperium: Secondary | ICD-10-CM

## 2013-02-09 NOTE — Patient Instructions (Signed)
It was great to see you again today!  If you think about it and decide you'd like a medicine for the sadness, please schedule an appointment to be seen. See the list of counselors I gave you. We can switch your birth control whenever you finish breastfeeding. Please schedule a visit when you are ready to switch. Breastfeeding is best for baby! Keep an eye on the red spots. If they worsen or change, return for a visit.  Be well, Dr. Pollie MeyerMcIntyre

## 2013-02-13 NOTE — Progress Notes (Signed)
Patient ID: Brenda MilanJaqueline Snow, female   DOB: 07/23/1980, 33 y.o.   MRN: [id number]  HPI:  Postpartum visit: patient is 8 weeks postpartum following a spontaneous vaginal delivery. -Breastfeeding: yes, going well, also supplementing some with formula -Contraception: using progesterone only pill. Would like to switch to a "stronger" pill - advised we cannot switch to a different pill until she is done with breastfeeding -Bleeding: has resumed menses, approximately 1 month postpartum -Bonding with infant: going well -Sexual activity: has resumed, no problems -Mood: has felt sad frequently. Denies SI/HI. Her mother died during this pregnancy, and she has been grieving this recently. She is amenable to counseling but does not want to start a medicine at this time. She does not sleep much because she is up with the baby frequently. She hides a lot of her emotions because she doesn't want her children to see her sad. She is able to talk to her husband about her feelings and thinks he is very supportive of her. -Other concerns: has noticed increasing in number of red bumps on her skin. They do not hurt or itch. She wants to know what they are.  ROS: See HPI  PMFSH: [id number] delivered at 208w6d, unremarkable pregnancy other than GBS positive, adequately treated.  PHYSICAL EXAM: BP 106/72  Pulse 74  Temp(Src) 98.6 F (37 C) (Oral)  Ht 5\' 4"  (1.626 m)  Wt 158 lb 3.2 oz (71.759 kg)  BMI 27.14 kg/m2 Gen: NAD HEENT: NCAT Breasts: normal in appearance, no masses or erythema of breasts Heart: RRR Lungs: CTAB Abdomen: soft, nontender to palpation Neuro: grossly nonfocal, speech intact Skin: scattered rare small red dots on arms and trunk, c/w hemangiomas  ASSESSMENT/PLAN:  # Postpartum checkup: doing well but does have sx's concerning for postpartum depression. Pt is sure that she does not want to start pharmacotherapy for this but is amenable to the idea of seeing a counselor. I have given  her a list of available counselors in the [place] area and have encouraged her to call them to schedule an appointment. She will continue with progesterone only contraception until she's done with breastfeeding, at which time we can switch her to an estrogen containing OCP. Advised she schedule an appointment when she is ready to switch.  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 Advised these are benign and perhaps are worsening due to hormonal changes postpartum. Advised that she keep an eye on them and follow up if they change or worsen.  FOLLOW UP: F/u when ready to discuss other contraceptive pills  GrenadaBrittany J. Pollie MeyerMcIntyre, MD [facility]

## 2013-02-24 ENCOUNTER — Ambulatory Visit (INDEPENDENT_AMBULATORY_CARE_PROVIDER_SITE_OTHER): Payer: No Typology Code available for payment source | Admitting: Emergency Medicine

## 2013-02-24 ENCOUNTER — Encounter: Payer: Self-pay | Admitting: Emergency Medicine

## 2013-02-24 VITALS — BP 133/75 | HR 73 | Temp 98.3°F | Ht 62.0 in | Wt 159.0 lb

## 2013-02-24 DIAGNOSIS — L293 Anogenital pruritus, unspecified: Secondary | ICD-10-CM

## 2013-02-24 DIAGNOSIS — N898 Other specified noninflammatory disorders of vagina: Secondary | ICD-10-CM

## 2013-02-24 DIAGNOSIS — R070 Pain in throat: Secondary | ICD-10-CM | POA: Insufficient documentation

## 2013-02-24 DIAGNOSIS — N949 Unspecified condition associated with female genital organs and menstrual cycle: Secondary | ICD-10-CM

## 2013-02-24 LAB — T4, FREE: Free T4: 0.83 ng/dL (ref 0.80–1.80)

## 2013-02-24 LAB — POCT WET PREP (WET MOUNT): CLUE CELLS WET PREP WHIFF POC: NEGATIVE

## 2013-02-24 LAB — TSH: TSH: 0.818 u[IU]/mL (ref 0.350–4.500)

## 2013-02-24 LAB — T3, FREE: T3, Free: 3.1 pg/mL (ref 2.3–4.2)

## 2013-02-24 NOTE — Assessment & Plan Note (Signed)
History most consistent with a globus sensation. She had some mild erythema over her thyroid, but no nodules or tenderness. Will check TSH, free T3 and free T4. Recommended ibuprofen for discomfort. F/u with PCP in 2 weeks.

## 2013-02-24 NOTE — Assessment & Plan Note (Signed)
Also with discharge. Wet prep consistent with post-pregnancy; no signs of infection. Suspect this is all related to vaginal delivery 2.5 months ago. Recommended that she stop the vaginal soap. Follow up with PCP in 2 weeks.

## 2013-02-25 NOTE — Progress Notes (Signed)
   Subjective:    [name], female    DOB: 12/13/1980, 33 y.o.   MRN: [id number]  HPI Brenda Snow is here for a SDA for throat discomfort and vaginal pain.  An interpreter was present for the entire visit.  1. Throat discomfort: Reports a sensation of something stuck in her throat for the last week.  Denies any pain or difficulty swallowing.  No recent choking event.  No cough or nasal symptoms.  2. Vaginal pain: Reports intermittent sharp vaginal pain for at least the last month.  She had a vaginal delivery about 2.5 months ago.  Also reports a dark brown discharge with a foul odor.  + vaginal itching.  She has been using a vaginal soap for the last month.   Current Outpatient Prescriptions on File Prior to Visit  Medication Sig Dispense Refill  . norethindrone (ORTHO MICRONOR) 0.35 MG tablet Take 1 tablet (0.35 mg total) by mouth daily.  1 Package  2  . Prenatal Multivit-Min-Fe-FA (PRENATAL VITAMINS) 0.8 MG tablet Take 1 tablet by mouth daily.  30 tablet  7   No current facility-administered medications on file prior to visit.    I have reviewed and updated the following as appropriate: allergies and current medications SHx: non smoker   Review of Systems See HPI    Objective:   Physical Exam BP 133/75  Pulse 73  Temp(Src) 98.3 F (36.8 C) (Oral)  Ht 5\' 2"  (1.575 m)  Wt 159 lb (72.122 kg)  BMI 29.07 kg/m2 Gen: alert, cooperative, NAD HEENT: AT/[place], sclera white, MMM, some mild erythema overlying the thyroid, thyroid normal without nodules Neck: supple, no LAD CV: RRR, no murmurs Pulm: CTAB, no wheezes or rales Abd: +BS, soft, NTND Pelvic: normal external genitalia; normal vagina; minimal discharge seen; normal post-partum cervix     Assessment & Plan:

## 2013-03-11 ENCOUNTER — Encounter: Payer: Self-pay | Admitting: Family Medicine

## 2013-03-11 ENCOUNTER — Ambulatory Visit (INDEPENDENT_AMBULATORY_CARE_PROVIDER_SITE_OTHER): Payer: No Typology Code available for payment source | Admitting: Family Medicine

## 2013-03-11 VITALS — BP 128/77 | HR 76 | Temp 98.1°F | Wt 159.0 lb

## 2013-03-11 DIAGNOSIS — R002 Palpitations: Secondary | ICD-10-CM

## 2013-03-11 MED ORDER — NORETHINDRONE 0.35 MG PO TABS: 1.0000 | ORAL_TABLET | Freq: Every day | ORAL | Status: DC

## 2013-03-11 NOTE — Progress Notes (Signed)
Patient ID: Brenda MilanJaqueline Snow, female   DOB: 09/13/1980, 33 y.o.   MRN: [id number]  Spanish interpreter utilized during this visit.   HPI:  Palpitations: pt complains that her heart has been beating fast every night. She has not had any chest pain or pressure. When she lays down to go to sleep, she notices her heart beating fast, and feels desperate and anxious. She also feels tired and weak when it happens. She has not had any swelling in her legs. At last visit we discussed postpartum mood disturbance, but she states that her mood has actually improved. She denies any SI/HI. Does not drink caffeine but does say that she had sx's similar to this in the past when she has had caffeine.  Contraception: requests refill of her birth control pills, would like 9 months worth. She has been taking micronor daily. She had a normal period for one day, about 8 weeks after her delivery but has not had a period since then. She is actively breastfeeding.   ROS: See HPI. Does mention that her hair has been falling out.  PMFSH: [id number] delivered vaginally several months ago  PHYSICAL EXAM: BP 128/77  Pulse 76  Temp(Src) 98.1 F (36.7 C) (Oral)  Wt 159 lb (72.122 kg)  Breastfeeding? Yes Gen: NAD, breastfeeding baby HEENT: NCAT Heart: RRR, no murmurs, clicks, or rubs Lungs: CTAB, NWOB Neuro: grossly nonfocal, speech intact Ext: no calf tenderness bilaterally  ASSESSMENT/PLAN:  # Contraception: will refill micronor x 9 months per patient request. Menstrual irregularity likely secondary to progesterone contraception and ongoing lactation.  # Hair falling out: did not fully address this today with pt. Advised that hair falling out is somewhat normal after giving birth due to hormonal changes. Pt endorsed that she used to take Alive brand vitamins. I advised that I am unable to recommend a specific vitamin to her other than a prenatal vitamin, given the variation of components of specific vitamins  and the fact that she is breastfeeding.  See problem based charting for additional assessment/plan.  FOLLOW UP: F/u in 3 weeks to review holter monitor results and f/u on palpitations.  GrenadaBrittany J. Pollie MeyerMcIntyre, MD [facility]

## 2013-03-11 NOTE — Patient Instructions (Signed)
It was great to see you again today!  We are getting a heart monitor set up for you. If you have chest pain, shortness of breath, or your heart feels worse, please go to the ER right away.  Follow up with me in 3 weeks.  Be well, Dr. Pollie MeyerMcIntyre

## 2013-03-12 DIAGNOSIS — R002 Palpitations: Secondary | ICD-10-CM | POA: Insufficient documentation

## 2013-03-12 NOTE — Assessment & Plan Note (Addendum)
Exam normal today. Suspect this could be related to stress or anxiety, but I do think she warrants additional workup. Has had normal thyroid studies within the last month. Will proceed with 24 hour holter monitor to see if we can capture the palpitations. Will not perform EKG today as pt is currently asymptomatic and sx's have occurred only at nighttime. Given return precautions per AVS. F/u in office in 3 weeks to review holter monitor results and evaluate for any changes in symptoms. Precepted with Dr. Lum BabeEniola who agrees with this plan.

## 2013-03-14 ENCOUNTER — Telehealth: Payer: Self-pay | Admitting: *Deleted

## 2013-03-14 NOTE — Telephone Encounter (Signed)
Okay, please message me when we know something.

## 2013-03-14 NOTE — Telephone Encounter (Signed)
Yes please.  Zohar Laing, Darlyne RussianKristen L, CMA

## 2013-03-14 NOTE — Telephone Encounter (Signed)
Pt has [employer] Card only and [facility] does not participate.  MD wanted a 24 hour Holter Monitor placed.  [facility]care is going to let me know how much the Holter Monitor costs and I will notify patient.  Please place referral for 24 hour holter.  Pt's husband is out of work currently and will more than likely not want to have this placed.  Dewitte Vannice, Darlyne RussianKristen L, CMA

## 2013-03-14 NOTE — Telephone Encounter (Signed)
Should I wait to enter referral until after we know whether pt will be able to afford this? Couldn't tell from the message. Latrelle DodrillBrittany J Franklin Baumbach, MD

## 2013-03-17 NOTE — Telephone Encounter (Signed)
LM with Jasmine DecemberSharon again yesterday, to please call me with info regarding cost of 24 hour Holter.  Tarquin Welcher, Darlyne RussianKristen L, CMA

## 2013-04-07 ENCOUNTER — Ambulatory Visit: Payer: No Typology Code available for payment source | Admitting: Family Medicine

## 2013-04-14 ENCOUNTER — Ambulatory Visit: Payer: No Typology Code available for payment source | Admitting: Family Medicine

## 2013-05-04 ENCOUNTER — Telehealth: Payer: Self-pay | Admitting: Family Medicine

## 2013-05-04 NOTE — Telephone Encounter (Signed)
Will FWD to Dr. Pollie MeyerMcIntyre.  Miosha Behe, Darlyne RussianKristen L, CMA

## 2013-05-04 NOTE — Telephone Encounter (Signed)
Pt stopped  breast feeding and needs Dr. Pollie MeyerMcintyre change her birthcontrol pills.   [employer]

## 2013-05-05 NOTE — Telephone Encounter (Signed)
Patient needs appointment to discuss switching birth control, because we need to go over the risks & benefits of different forms of OCPs.

## 2013-05-05 NOTE — Telephone Encounter (Signed)
Called pt. Tried to explain message, but pt is not able to understand. She request for [employer] to call her. Will fwd to [employer]. Thanks. Lorenda Hatchet.Lynnsie Linders, Renato Battleshekla

## 2013-05-09 NOTE — Telephone Encounter (Signed)
Pt has an appt on 05/04@1 :45 with her PCP.  [employer]

## 2013-05-31 ENCOUNTER — Telehealth: Payer: Self-pay | Admitting: Family Medicine

## 2013-05-31 NOTE — Telephone Encounter (Signed)
Per Brenda MandesKirsten have patient follow up with Dr. Pollie MeyerMcIntyre on 06/06/13 to discuss the monitor and palpation.  Patient ok with that.

## 2013-05-31 NOTE — Telephone Encounter (Signed)
05-27-13  Patient come into the office today with interpreter, stating she was told by the office to make an appointment to see a heart doctor and get a monitor.   I told her I didn't have a referral to see th e MD, only an old order for 24hr monitor.   She stated that she had the orange card.  I told her I would need to call  FM to see what was going on.  I spoke with Baxter HireKristen regarding paitent request.   I spoke with the office back in February regarding the [facility] and how much she will need to pay up front  $175.00.  I explain this to the patient.  She stated that no one called her about this.

## 2013-06-06 ENCOUNTER — Encounter: Payer: Self-pay | Admitting: Family Medicine

## 2013-06-06 ENCOUNTER — Ambulatory Visit (INDEPENDENT_AMBULATORY_CARE_PROVIDER_SITE_OTHER): Payer: No Typology Code available for payment source | Admitting: Family Medicine

## 2013-06-06 VITALS — BP 133/85 | HR 61 | Temp 98.0°F | Ht 62.0 in | Wt 153.0 lb

## 2013-06-06 DIAGNOSIS — R002 Palpitations: Secondary | ICD-10-CM

## 2013-06-06 DIAGNOSIS — J302 Other seasonal allergic rhinitis: Secondary | ICD-10-CM

## 2013-06-06 DIAGNOSIS — Z309 Encounter for contraceptive management, unspecified: Secondary | ICD-10-CM

## 2013-06-06 DIAGNOSIS — J309 Allergic rhinitis, unspecified: Secondary | ICD-10-CM

## 2013-06-06 LAB — CBC
HCT: 40.6 % (ref 36.0–46.0)
Hemoglobin: 14.1 g/dL (ref 12.0–15.0)
MCH: 31.5 pg (ref 26.0–34.0)
MCHC: 34.7 g/dL (ref 30.0–36.0)
MCV: 90.8 fL (ref 78.0–100.0)
PLATELETS: 243 10*3/uL (ref 150–400)
RBC: 4.47 MIL/uL (ref 3.87–5.11)
RDW: 13 % (ref 11.5–15.5)
WBC: 9 10*3/uL (ref 4.0–10.5)

## 2013-06-06 LAB — TSH: TSH: 1.214 u[IU]/mL (ref 0.350–4.500)

## 2013-06-06 MED ORDER — CETIRIZINE HCL 10 MG PO TABS: 10.0000 mg | ORAL_TABLET | Freq: Every day | ORAL | Status: DC

## 2013-06-06 MED ORDER — CETIRIZINE HCL 10 MG PO TABS: 10.0000 mg | ORAL_TABLET | Freq: Every day | ORAL | Status: AC

## 2013-06-06 MED ORDER — NORGESTIMATE-ETH ESTRADIOL 0.25-35 MG-MCG PO TABS: 1.0000 | ORAL_TABLET | Freq: Every day | ORAL | Status: DC

## 2013-06-06 NOTE — Patient Instructions (Addendum)
It was great to see you again today!  I sent in a new prescription for birth control for you. Take it at the same time each day.  I also sent in zyrtec for your allergies.  We are checking an ultrasound of your heart, and a heart monitor. I will call you if your test results are not normal.  Otherwise, I will send you a letter.  If you do not hear from me with in 2 weeks please call our office.      Follow up with me in 1 month to review these results.  Be well, Dr. Pollie MeyerMcIntyre  Informacin sobre los anticonceptivos hormonales (Hormonal Contraception Information) El estrgeno y la progesterona (progestina) son hormonas usadas en muchas formas de control de natalidad (contracepcin). Estas 2 hormonas se encuentran en la mayora de los anticonceptivos hormonales. Los anticonceptivos hormonales utilizan:   Una combinacin de las hormonas estrgeno y Education officer, museumprogesterona en una de estas formas:  Pldoras: vienen en varias combinaciones de pldoras con hormona activa y pldoras no hormonales. Las Abbott Laboratoriesdiferentes combinaciones pueden provocar que tenga el perodo una vez por mes, una vez cada 3 meses o que no tenga su perodo. Es importante que las tome a la misma hora todos [place] das.  El parche: se coloca en la parte inferior del abdomen todas las semanas durante 3 semanas. No se coloca en la cuarta semana.  Anillo vaginal: se coloca en la vagina y se deja all durante 3 semanas. Luego se retira por una semana.  Progesterona sola en forma de:  Pldoras: las pldoras hormonales se toman todos los [address]  [address] del [place].  El dispositivo intrauterino (DIU): se inserta durante un perodo menstrual y se retira o se reemplaza cada 5 aos o menos.  Implante: Se colocan unas varillas plsticas debajo de la piel, en la zona superior del brazo. Se retiran o se reemplazan cada 3 aos o menos.  Inyeccin: se aplica una vez cada [address]90 das. Puede producirse un embarazo con cualquiera de estos mtodos anticonceptivos  hormonales. Si sospecha que est embarazada, hgase un test de embarazo y converse con su mdico.  ANTICONCEPTIVOS DE ESTRGENO Y PROGESTERONA Los anticonceptivos de estrgeno y progesterona pueden evitar un embarazo:  Impidiendo la liberacin de un vulo (ovulacin).  Espesando el moco cervical lo que dificulta la entrada de los espermatozoides al tero.  Modificando el revestimiento interno del tero. Esta modificacin hace que sea ms difcil que se implante el huevo. Los efectos secundarios de los estrgenos son ms frecuentes en los primeros 2 o 3 meses. Hable con su mdico Lockheed Martinsobre los efectos secundarios que pueden afectarla. Si usted tiene Calpine Corporationefectos secundarios persistentes o son graves, hable con su mdico. ANTICONCEPTIVOS DE PROGESTERONA Los anticonceptivos que solo contienen progesterona pueden evitar el embarazo a travs de:   El bloqueo de la ovulacin. Esto se produce en muchas mujeres, pero algunas continuarn ovulando.   Impidiendo la entrada de los espermatozoides en el tero al mantener el moco cervical espeso y pegajoso.   Modificando el revestimiento interno del tero. Este cambio hace que sea ms difcil que se implante el huevo.  Los efectos secundarios de la progesterona pueden variar. Hable con su mdico Lockheed Martinsobre los efectos secundarios que pueden afectarla. Si usted tiene efectos secundarios persistentes o son graves, hable con su mdico.  Document Released: 10/15/2011 Document Revised: 09/22/2012 [facility] Patient Information 2014 [place] [place].

## 2013-06-07 ENCOUNTER — Encounter: Payer: Self-pay | Admitting: Family Medicine

## 2013-06-10 ENCOUNTER — Encounter: Payer: Self-pay | Admitting: Radiology

## 2013-06-10 ENCOUNTER — Encounter (INDEPENDENT_AMBULATORY_CARE_PROVIDER_SITE_OTHER): Payer: No Typology Code available for payment source

## 2013-06-10 DIAGNOSIS — R002 Palpitations: Secondary | ICD-10-CM

## 2013-06-10 NOTE — Progress Notes (Signed)
[name], female   DOB: 03/27/1980, 33 y.o.   MRN: [id number] Evo 24hr holter applied

## 2013-06-12 DIAGNOSIS — Z309 Encounter for contraceptive management, unspecified: Secondary | ICD-10-CM | POA: Insufficient documentation

## 2013-06-12 NOTE — Progress Notes (Signed)
[name], female   DOB: 02/15/1980, 33 y.o.   MRN: [id number]  Spanish interpreter utilized during this visit.  HPI:  Birth control: would like to switch birth control. Has just been taking norethindrone. Takes it every day, never misses pills. Recently stopped breastfeeding and would now like an estrogen containing pill. Just finished her period yesterday. Has period about 2x/month (this preceded her recent pregnancy). She bleeds for 4-5 days. Never has spotting or bleeding in between periods. No hx of blood clots, migraines with aura. Does not smoke cigarettes.  Palpitations: continues to feel palpitations, racing heart, cold/chills. Occasionally is short of breath. No pain with exertion. Does feel fatigued. We had previously tried to get her set up for a holter monitor but it appears there has been confusion about how this was scheduled.  Allergies: has been congested in her nose and her eyes burn/itch. Thinks she has allergies. Would like a medicine for this.  ROS: See HPI  PMFSH: [id number], recently delivered vaginally  PHYSICAL EXAM: BP 133/85  Pulse 61  Temp(Src) 98 F (36.7 C) (Oral)  Ht 5\' 2"  (1.575 m)  Wt 153 lb (69.4 kg)  BMI 27.98 kg/m2  Breastfeeding? No Gen: NAD HEENT: NCAT Heart: RRR, 2/6 systolic murmur present Lungs: CTAB, NWOB Abdomen: soft, nontender to palpation Neuro: grossly nonfocal, speech intact Ext: No appreciable lower extremity edema bilaterally   ASSESSMENT/PLAN:  See problem based charting for additional assessment/plan.  FOLLOW UP: F/u in 1 month to review results of echo & holter monitor.  GrenadaBrittany J. Pollie MeyerMcIntyre, MD [facility]

## 2013-06-12 NOTE — Assessment & Plan Note (Signed)
Rx zyrtec for allergies.

## 2013-06-12 NOTE — Assessment & Plan Note (Signed)
No contraindications to combined OCP's. Will start sprintec.

## 2013-06-12 NOTE — Assessment & Plan Note (Addendum)
We will work again to get her set up for her holter monitor. Will also check echo given that she endorses SOB and I do hear a murmur on exam today. Suspect this is a benign murmur but since she's having sx's, will check echo. Will also check CBC to ensure anemia not contributing. F/u in office in 1 month to review results.

## 2013-06-17 ENCOUNTER — Ambulatory Visit (HOSPITAL_COMMUNITY)
Admission: RE | Admit: 2013-06-17 | Discharge: 2013-06-17 | Disposition: A | Discharge status: Home / Self Care | Payer: No Typology Code available for payment source | Source: Ambulatory Visit | Attending: Family Medicine | Admitting: Family Medicine

## 2013-06-17 DIAGNOSIS — R0602 Shortness of breath: Secondary | ICD-10-CM

## 2013-06-17 DIAGNOSIS — R002 Palpitations: Secondary | ICD-10-CM

## 2013-06-17 NOTE — Progress Notes (Signed)
Echocardiogram 2D Echocardiogram has been performed.  Brenda Snow Brenda Snow 06/17/2013, 10:12 AM

## 2013-06-29 ENCOUNTER — Ambulatory Visit: Payer: No Typology Code available for payment source

## 2013-07-05 ENCOUNTER — Encounter: Payer: Self-pay | Admitting: Family Medicine

## 2013-07-11 ENCOUNTER — Ambulatory Visit (INDEPENDENT_AMBULATORY_CARE_PROVIDER_SITE_OTHER): Payer: No Typology Code available for payment source | Admitting: Family Medicine

## 2013-07-11 VITALS — BP 133/78 | HR 65 | Ht 62.0 in | Wt 152.0 lb

## 2013-07-11 DIAGNOSIS — Z309 Encounter for contraceptive management, unspecified: Secondary | ICD-10-CM

## 2013-07-11 NOTE — Patient Instructions (Signed)
It was great to see you again today!  Your heart tests were normal. Since the palpitations aren't bothering you, we can just observe for now. Follow up in 4 months, or sooner if you have any problems.  Be well, Dr. Pollie Meyer

## 2013-07-13 NOTE — Progress Notes (Signed)
[name], female   DOB: 07/28/1980, 33 y.o.   MRN: [id number]  HPI:  Spanish interpreter utilized during this visit.  Pt presents to f/u on palpitations and review results of Holter monitor and echo. She states she now has palpitations just once in a while. Denies chest pain or SOB. No syncope.   ROS: See HPI  PMFSH: seasonal allergies, palpitations, on sprintec for birth control  PHYSICAL EXAM: BP 133/78  Pulse 65  Ht 5\' 2"  (1.575 m)  Wt 152 lb (68.947 kg)  BMI 27.79 kg/m2 Gen: NAD, pleasant, cooperative HEENT: NCAT Heart: RRR, no murmurs Lungs: CTAB, NWOB Neuro: grossly nonfocal, speech normal, follows commands, alert and oriented  ASSESSMENT/PLAN:  See problem based charting for additional assessment/plan.  FOLLOW UP: F/u in four months for palpitations  [place] J. Pollie Meyer, MD [facility]

## 2013-07-13 NOTE — Assessment & Plan Note (Signed)
Holter monitor just showed sinus rhythm with rare PAC. Echo was normal. As palpitations are not continuing to bother pt frequently, will continue to monitor. F/u in 4 months to check in on how she's doing.

## 2013-10-03 ENCOUNTER — Ambulatory Visit (INDEPENDENT_AMBULATORY_CARE_PROVIDER_SITE_OTHER): Payer: No Typology Code available for payment source | Admitting: Family Medicine

## 2013-10-03 ENCOUNTER — Encounter: Payer: Self-pay | Admitting: Family Medicine

## 2013-10-03 VITALS — BP 120/68 | HR 64 | Temp 98.4°F | Ht 62.0 in | Wt 150.0 lb

## 2013-10-03 DIAGNOSIS — R1319 Other dysphagia: Secondary | ICD-10-CM

## 2013-10-03 DIAGNOSIS — G43119 Migraine with aura, intractable, without status migrainosus: Secondary | ICD-10-CM

## 2013-10-03 DIAGNOSIS — G43109 Migraine with aura, not intractable, without status migrainosus: Secondary | ICD-10-CM | POA: Insufficient documentation

## 2013-10-03 NOTE — Assessment & Plan Note (Signed)
History consistent with migraine with aura, no red flag symptoms. Pain controlled with OTC medications and only reports 3 episodes so far. Plan: continue OTC pain medications, asked to make headache log if symptoms persist to better gauge need for prophylactic/prescription medications.

## 2013-10-03 NOTE — Patient Instructions (Signed)
The headaches you are having are most likely Migraines with Aura. You can continue using over the counter medications to help with the headaches including aleve, ibuprofen, tylenol, excedrin migraine. If the headaches become severe and daily, there are medications we can use to help prevent them. Please start taking a headache diary, this includes the date, how long the headache lasts, and what you took to help.  Cefalea migraosa (Migraine Headache) Una cefalea migraosa es un dolor intenso y punzante en uno o ambos lados de la cabeza. La migraa puede durar desde 30 minutos hasta varias horas. CAUSAS  No siempre se conoce la causa exacta de la cefalea migraosa. Sin embargo, IT consultant [employer] nervios del cerebro se irritan y liberan ciertas sustancias qumicas que causan inflamacin. Esto ocasiona dolor. Existen tambin ciertos factores que pueden desencadenar las migraas, como los siguientes:  Alcohol.  Fumar.  Estrs.  La menstruacin  Quesos aejados.  Los alimentos o las bebidas que contienen nitratos, glutamato, aspartamo o tiramina.  Falta de sueo.  Chocolate.  Cafena.  Hambre.  Actividad fsica extenuante.  Fatiga.  Medicamentos que se usan para tratar Aeronautical engineer (nitroglicerina), pldoras anticonceptivas, estrgeno y algunos medicamentos para la hipertensin arterial. SIGNOS Y SNTOMAS  Dolor en uno o ambos lados de la cabeza.  Dolor pulsante o punzante.  Dolor intenso que impide [employer] actividades diarias.  Dolor que se agrava por cualquier actividad fsica.  Nuseas, vmitos o ambos.  Mareos.  Dolor con la exposicin a las luces brillantes, a los ruidos fuertes o la N[place]be.  Sensibilidad general a las luces brillantes, a los ruidos fuertes o a los [employer]. Antes de sufrir una migraa, puede recibir seales de advertencia (aura). Un aura puede incluir:  Ver las luces intermitentes.  Ver puntos brillantes, halos o lneas  en zigzag.  Tener una visin en tnel o visin borrosa.  Sensacin de entumecimiento u hormigueo.  Dificultad para hablar  Debilidad muscular. DIAGNSTICO  La cefalea migraosa se diagnostica en funcin de lo siguiente:  Sntomas.  Examen fsico.  Neomia Dear TC (tomografa computada) o resonancia magntica de la cabeza. Estas pruebas de diagnstico por imagen no pueden diagnosticar las migraas, pero pueden ayudar a Sales promotion account executive otras causas de las cefaleas. TRATAMIENTO Le prescribirn medicamentos para Engineer, materials y las nuseas. Tambin podrn administrarse medicamentos para ayudar a Armed forces training and education officer.  INSTRUCCIONES PARA EL CUIDADO EN EL HOGAR  Slo tome medicamentos de venta libre o recetados para Primary school teacher o Environmental health practitioner, segn las indicaciones de su mdico. No se recomienda usar los opiceos a Air cabin crew.  Cuando tenga la migraa, acustese en un cuarto oscuro y tranquilo  Lleve un registro diario para Financial risk analyst lo que puede provocar las Soil scientist. Por ejemplo, escriba:  Lo que usted come y bebe.  Cunto tiempo duerme.  Algn cambio en su dieta o en los medicamentos.  Limite el consumo de bebidas alcohlicas.  Si fuma, deje de hacerlo.  Duerma entre 7 y 9horas, o segn las recomendaciones del mdico.  Limite el estrs.  Mantenga las luces tenues si le [employer] luces brillantes y la [place]. SOLICITE ATENCIN MDICA DE INMEDIATO SI:   La migraa se hace cada vez ms intensa.  Tiene fiebre.  Presenta rigidez en el cuello.  Tiene prdida de visin.  Presenta debilidad muscular o prdida del control muscular.  Comienza a perder el equilibrio o tiene problemas para caminar.  Sufre mareos o se desmaya.  Tiene sntomas  graves que son diferentes a los primeros sntomas. ASEGRESE DE QUE:   Comprende estas instrucciones.  Controlar su afeccin.  Recibir ayuda de inmediato si no mejora o si empeora. Document  Released: 01/20/2005 Document Revised: 11/10/2012 [facility] Patient Information 2015 [place], [place]. This information is not intended to replace advice given to you by your health care provider. Make sure you discuss any questions you have with your health care provider.

## 2013-10-03 NOTE — Progress Notes (Signed)
[name], female   DOB: 08/10/1980, 33 y.o.   MRN: [id number]   Subjective:    [name], female    DOB: [birth year], 33 y.o.   MRN: [id number]  Interpreter used for this same day visit  HPI  CC: headache  # Headache:  Has occurred for the past 2 weeks, has had 3 episodes  Pain primarily in the back of her head  She has associated blurry vision that occurs before the headache  Has photo and phonophobia with these episodes  Has taken: ibuprofen (good relief when she takes it)  Headache resolves with sleeping ROS: no changes in hearing, no passing out, no weakness of extremities  # Difficulty swallowing  Has had this problem for a long time  Difficulty/pain with swallowing both liquids and solids, not getting progressively worse  Has discussed this at other visits, had thyroid studies done but no imaging done ROS: denies heartburn, abdominal pain, +hair loss, +weight loss, +cold intolerance  Review of Systems   See HPI for ROS. All other systems reviewed and are negative. Objective:  BP 120/68  Pulse 64  Temp(Src) 98.4 F (36.9 C) (Oral)  Ht  (1.575 m)  Wt 150 lb (68.04 kg)  BMI 27.43 kg/m2  LMP 09/26/2013  Breastfeeding? No Vitals reviewed  General: NAD HEENT: PERRL, EOMI. MMM, no posterior erythema. Thyroid tender to palpation, but no appreciable enlargement or palpable nodules.   CV: RRR, normal heart sounds, soft systolic murmur best at LUSB and RUSB Resp: CTAB, normal effort Neuro: alert and oriented, CN grossly normal, no focal deficits  Assessment & Plan:  See Problem List Documentation

## 2013-10-03 NOTE — Assessment & Plan Note (Signed)
Unable to fully assess today, chronic issue that appears stable/not worsening. Given her age I would have low suspicion for mass causing difficulty with swallowing. Had previous workup including only thyroid studies which have been normal twice in the past 8 months. She does endorse tenderness and some symptoms of thyroid dysfunction (though not consistently hypo/hyperthyroid). Unable to draw labs today as patient arrived late and lab closed. Plan: asked patient to follow up with PCP regarding this issue within the next 1-2 weeks.

## 2013-12-05 ENCOUNTER — Encounter: Payer: Self-pay | Admitting: Family Medicine

## 2014-05-07 ENCOUNTER — Other Ambulatory Visit: Payer: Self-pay | Admitting: Family Medicine

## 2014-05-08 ENCOUNTER — Telehealth: Payer: Self-pay | Admitting: Family Medicine

## 2014-05-08 NOTE — Telephone Encounter (Signed)
[facility] please call pt and let her know I have refilled her birth control for one month but she needs to schedule an appointment to follow up with me in order to get more refills. Thanks!  Latrelle DodrillBrittany J Fitzgerald Dunne, MD

## 2014-05-09 NOTE — Telephone Encounter (Signed)
Left message on voicemail via [employer] [phone](ID#[id number]) with message from MD.

## 2014-06-30 ENCOUNTER — Other Ambulatory Visit: Payer: Self-pay | Admitting: Family Medicine

## 2014-06-30 ENCOUNTER — Encounter: Payer: Self-pay | Admitting: Family Medicine

## 2014-07-16 IMAGING — US US OB DETAIL+14 WK
1 series · 12 of 28 positions shown · non-contrast
Comparison: none

[Series 1: us ob detail +14 wk · 89 acquisitions, 12 frames shown]
[im 4/89]
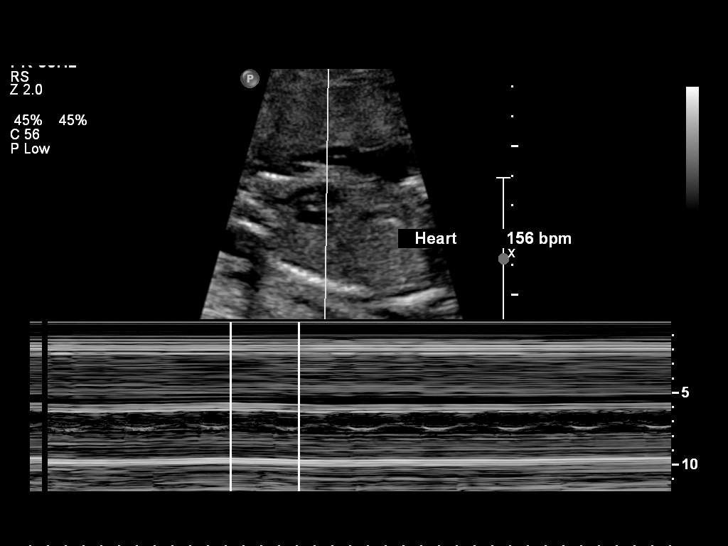
[im 10/89]
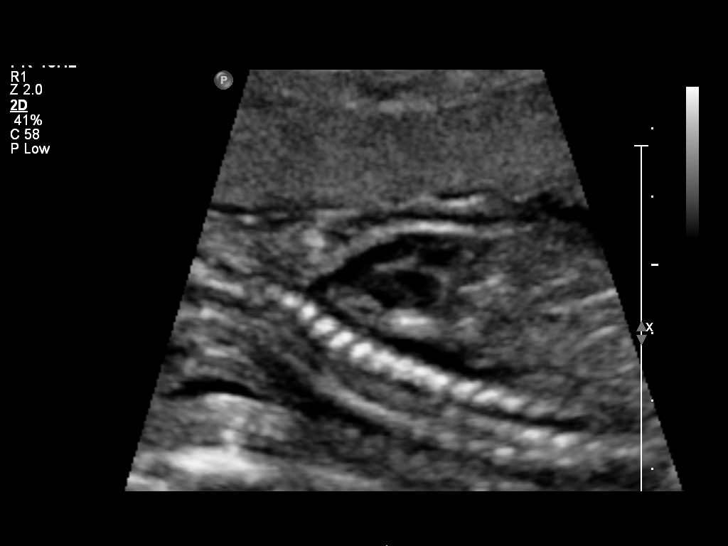
[im 17/89]
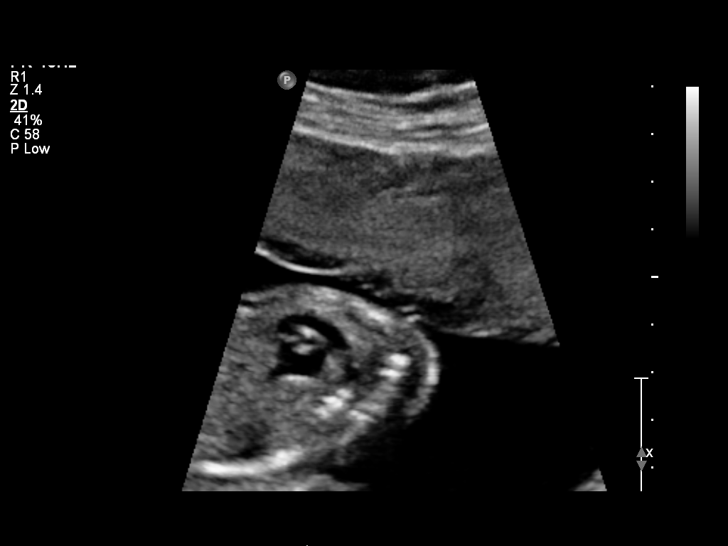
[im 27/89]
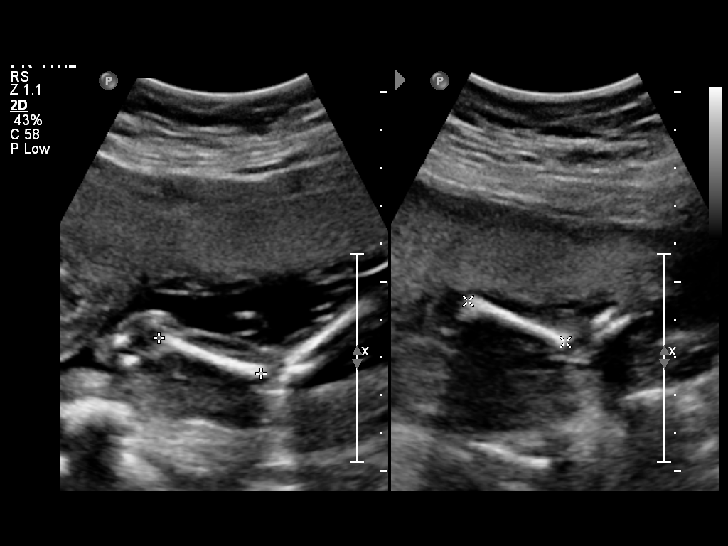
[im 33/89]
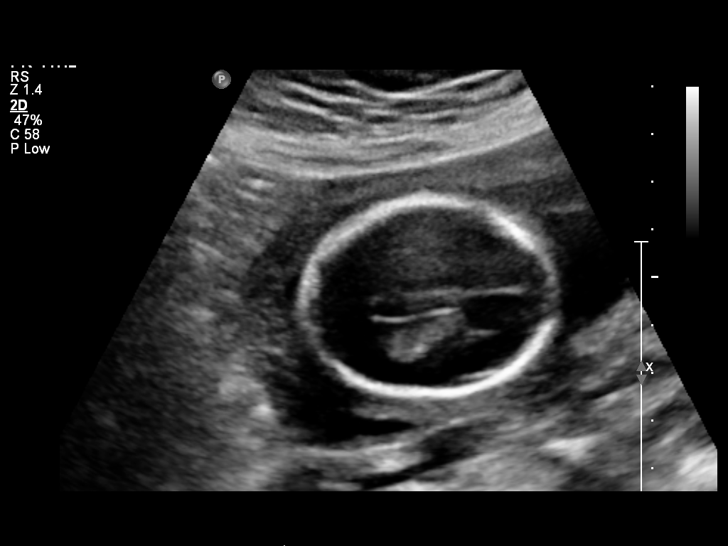
[im 40/89]
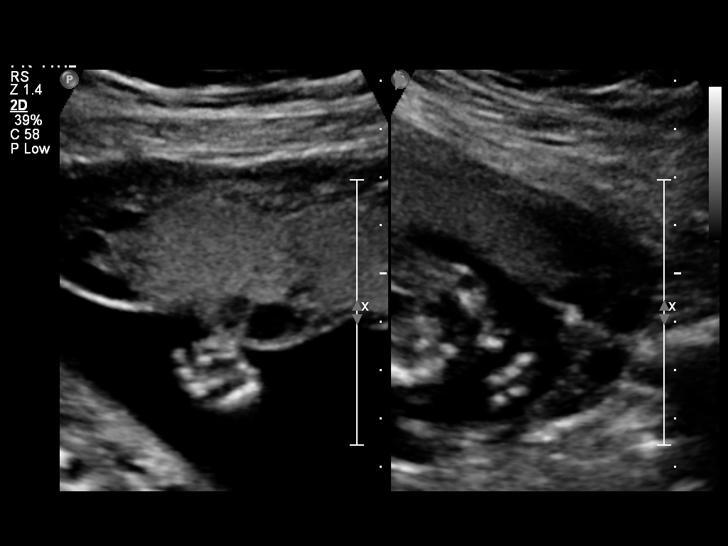
[im 49/89]
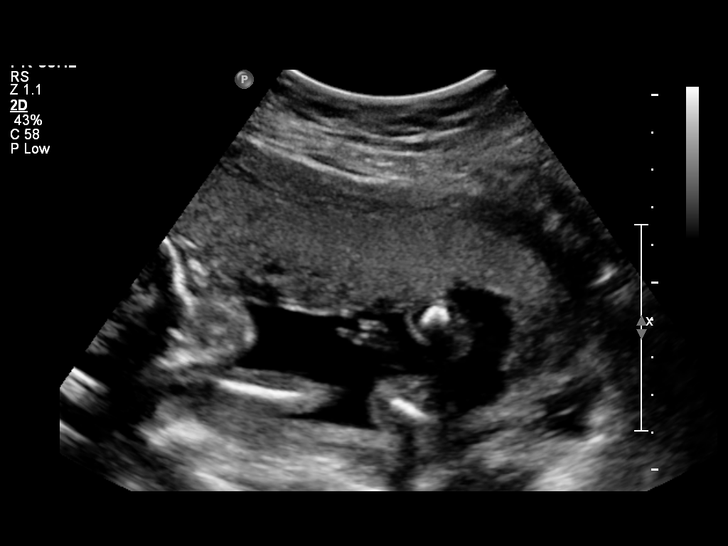
[im 56/89]
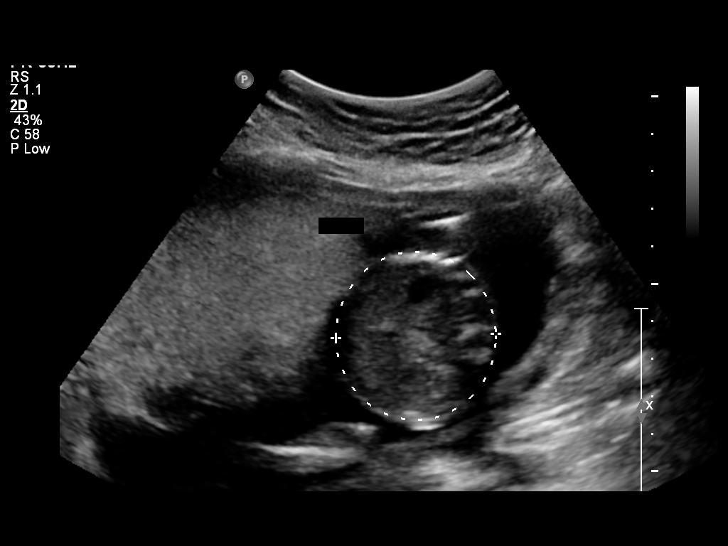
[im 62/89]
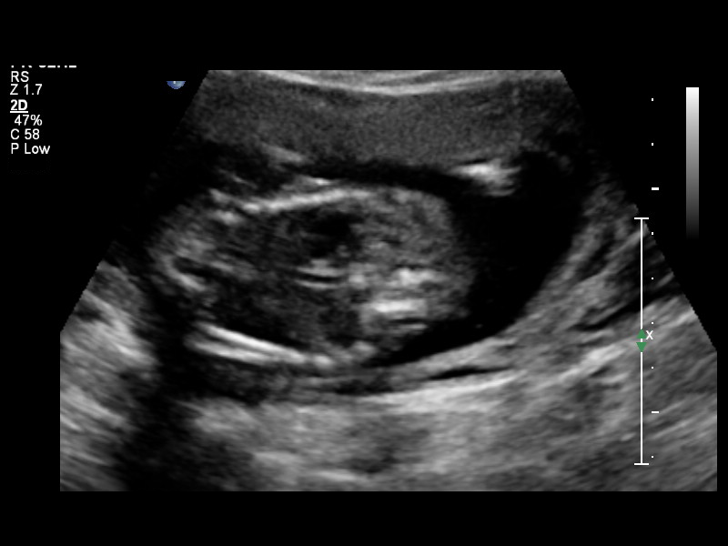
[im 72/89]
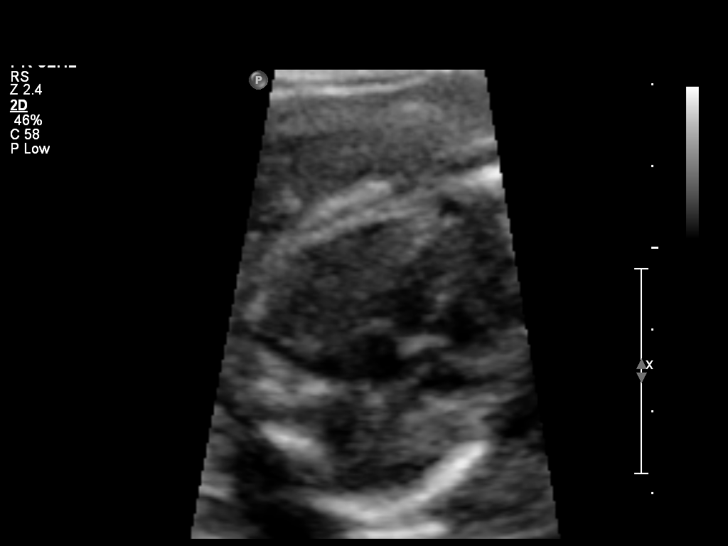
[im 79/89]
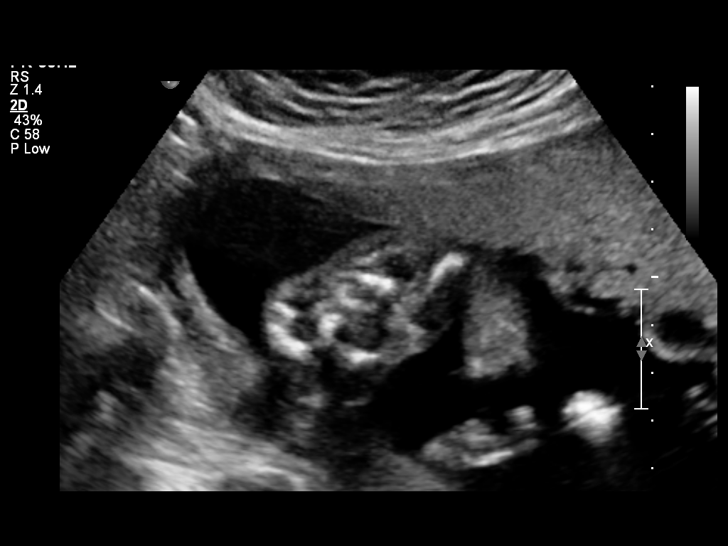
[im 85/89]
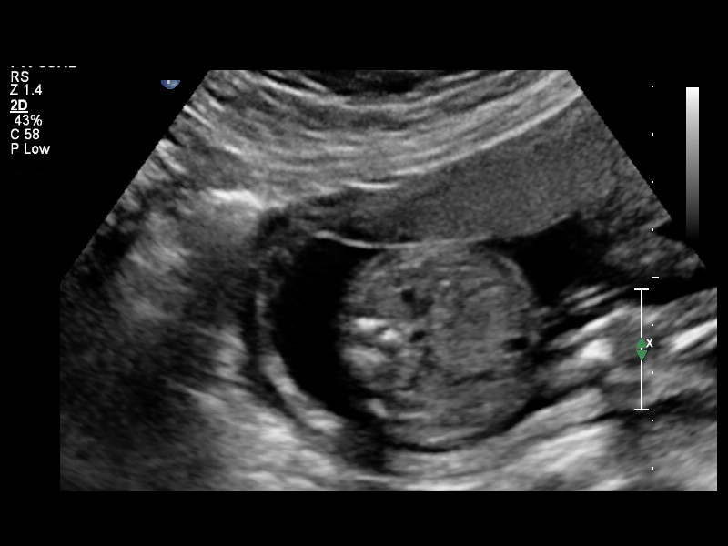

[12 of 28 positions shown; findings below may reference images not displayed]

OBSTETRICS REPORT
                      (Signed Final 07/22/2012 [DATE])

             EULARIO

Service(s) Provided

 US OB DETAIL + 14 WK                                  76811.0
Indications

 Detailed fetal anatomic survey
Fetal Evaluation

 Num Of Fetuses:    1
 Fetal Heart Rate:  156                         bpm
 Cardiac Activity:  Observed
 Presentation:      Breech, footling
 Placenta:          Anterior, above cervical os
 P. Cord            Visualized, central
 Insertion:

 Amniotic Fluid
 AFI FV:      Subjectively within normal limits
                                             Larg Pckt:   5.15   cm
Biometry

 BPD:     41.1  mm    G. Age:   18w 3d                CI:        65.39   70 - 86
                                                      FL/HC:      17.6   16.1 -

 HC:     163.2  mm    G. Age:   19w 1d       33  %    HC/AC:      1.17   1.09 -

 AC:       140  mm    G. Age:   19w 3d       49  %    FL/BPD:
 FL:      28.7  mm    G. Age:   18w 6d       27  %    FL/AC:      20.5   20 - 24
 HUM:     28.1  mm    G. Age:   19w 0d       45  %
 CER:     18.6  mm    G. Age:   18w 2d       22  %
 NFT:     4.01  mm

 Est. FW:     274  gm    0 lb 10 oz      42  %
Gestational Age

 LMP:           15w 6d       Date:   04/02/12                 EDD:   01/07/13
 U/S Today:     19w 0d                                        EDD:   12/16/12
 Best:          19w 2d    Det. By:   U/S (06/30/12)           EDD:   12/14/12
2nd Trimester Genetic Sonogram - Trisomy 21 Screening
 Age:                                             33          Risk=1:   389

 Structural anomalies (inc. cardiac):             N/A
 Echogenic bowel:                                 No
 Hypoplastic / absent midphalanx 5th Digit:       No
 Wide space 1st-5nd toes:                         N/A
 Pyelectasis:                                     No
 2-vessel umbilical cord:                         No
 Echogenic cardiac foci:                          N/A

 Incomplete anatomic evaluation
Anatomy

 Cranium:          Appears normal         Aortic Arch:      Appears normal
 Fetal Cavum:      Appears normal         Ductal Arch:      Appears normal
 Ventricles:       Appears normal         Diaphragm:        Appears normal
 Choroid Plexus:   Appears normal         Stomach:          Appears normal
 Cerebellum:       Appears normal         Abdomen:          Appears normal
 Posterior Fossa:  Appears normal         Abdominal Wall:   Appears nml (cord
                                                            insert, abd wall)
 Nuchal Fold:      Appears normal         Cord Vessels:     Appears normal (3
                                                            vessel cord)
 Face:             Orbits appear          Kidneys:          Appear normal
                   normal
 Lips:             Appears normal         Bladder:          Appears normal
 Heart:            Not well visualized    Spine:            Appears normal
 RVOT:             Appears normal         Lower             Appears normal
                                          Extremities:
 LVOT:             Appears normal         Upper             Appears normal
                                          Extremities:

 Other:  Fetus appears to be a male. Heels and 5th digit visualized.
         Technically difficult due to maternal habitus and fetal position.
Targeted Anatomy

 Fetal Central Nervous System
 Lat. Ventricles:  8.0                    Cisterna Magna:
Cervix Uterus Adnexa

 Cervical Length:   3.2       cm

 Cervix:       Normal appearance by transabdominal scan.
 Uterus:       No abnormality visualized.
 Cul De Sac:   No free fluid seen.

 Left Ovary:   Not visualized.
 Right Ovary:  Not visualized.
 Adnexa:     No abnormality visualized.
Impression

 Siup demonstrating an EGA by ultrasound of 19w 0d. This
 corresponds well with expected EGA by early ultrasound of
 19w 2d.

 Complete assessment of the fetal heart and face were
 compromised today due to fetal position. No focal fetal
 abnormalities are noted associated with visualized anatomy.
 Follow up can be performed in 2-3 weeks for hopeful
 improvement in assessment. No soft markers for Down
 Syndrome are seen.  Correlation with other aneuploidy
 screening results, if available, would be recommended for a
 more complete risk assessment.

 Subjectively and quantitatively normal amniotic fluid volume.
 Normal cervical length.

 BIMBELA with us.  Please do not hesitate to

## 2014-07-28 ENCOUNTER — Other Ambulatory Visit: Payer: Self-pay | Admitting: Family Medicine

## 2014-08-07 IMAGING — US US OB FOLLOW-UP
1 series · 12 of 28 positions shown · non-contrast
Comparison: none

[Series 1: us ob follow up · 59 acquisitions, 12 frames shown]
[im 3/59]
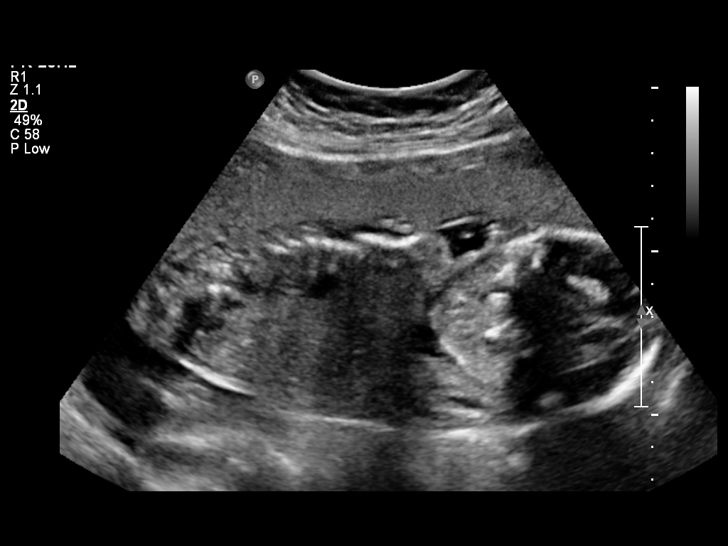
[im 7/59]
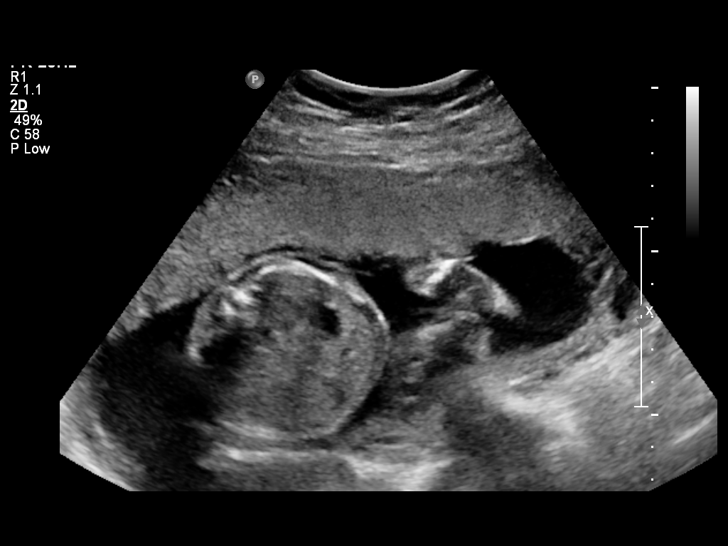
[im 11/59]
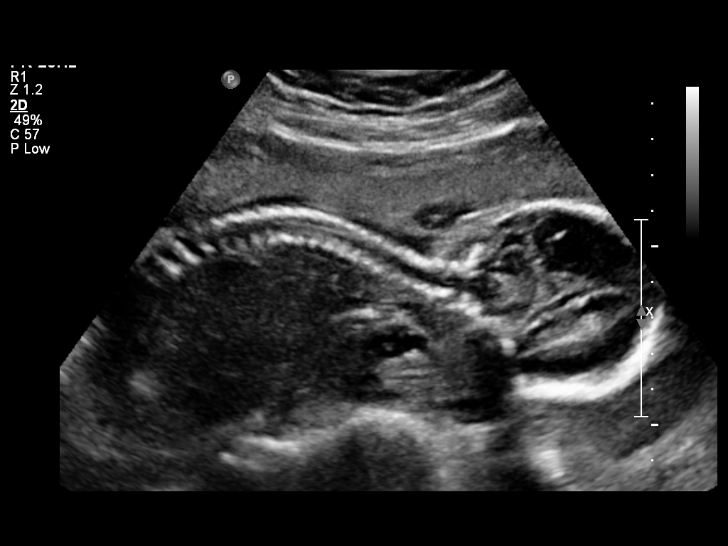
[im 18/59]
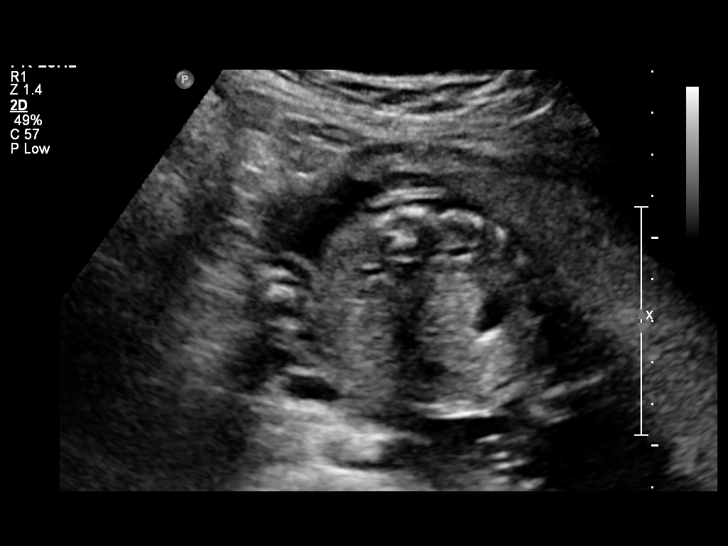
[im 22/59]
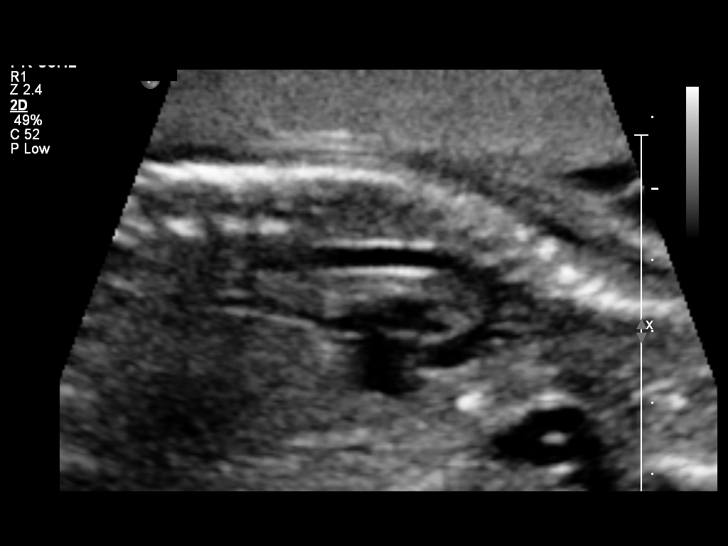
[im 26/59]
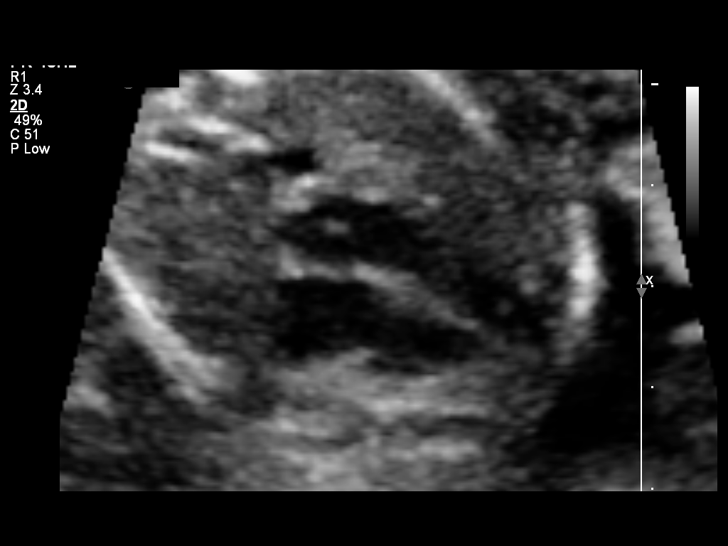
[im 33/59]
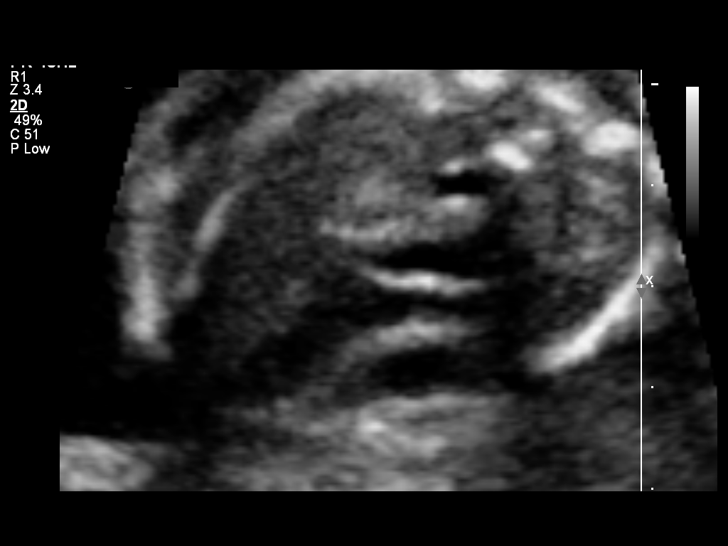
[im 37/59]
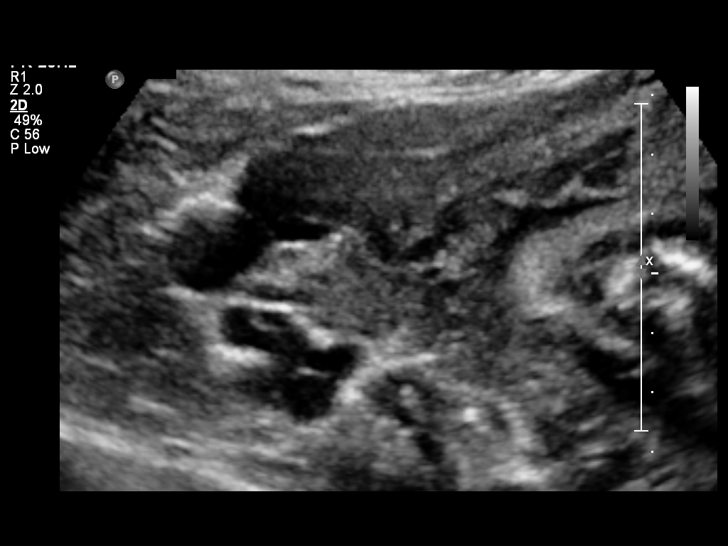
[im 41/59]
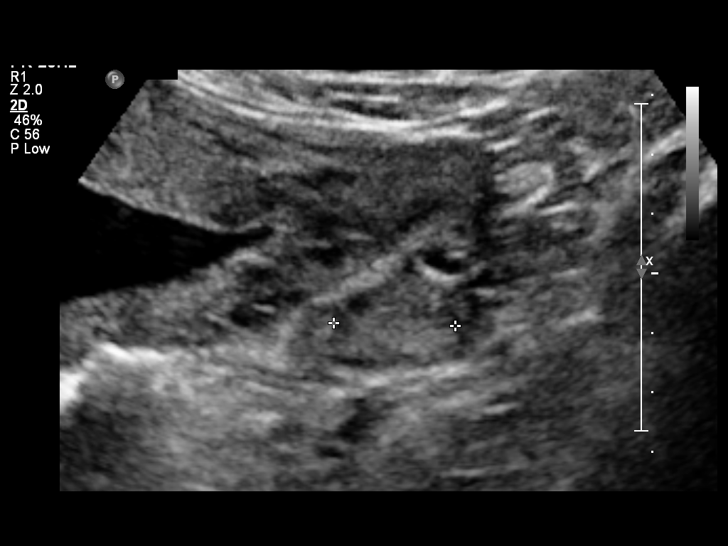
[im 48/59]
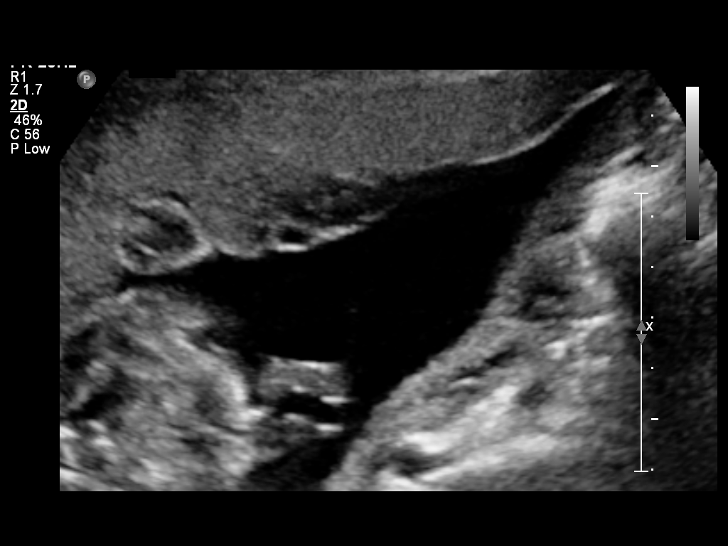
[im 52/59]
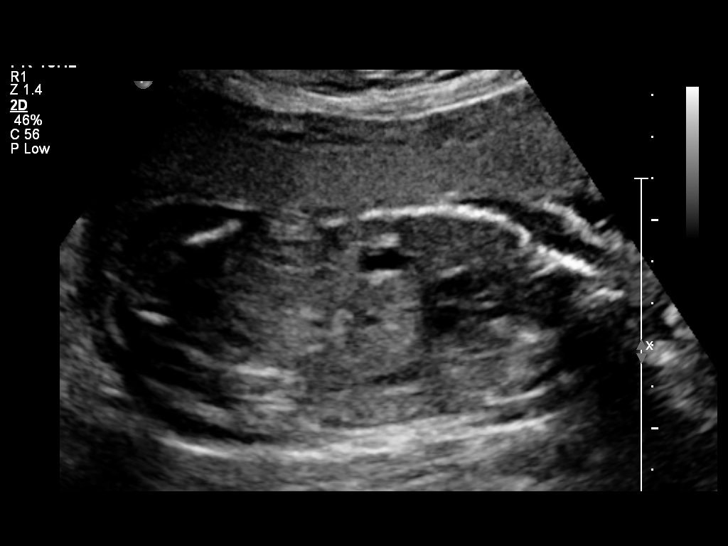
[im 56/59]
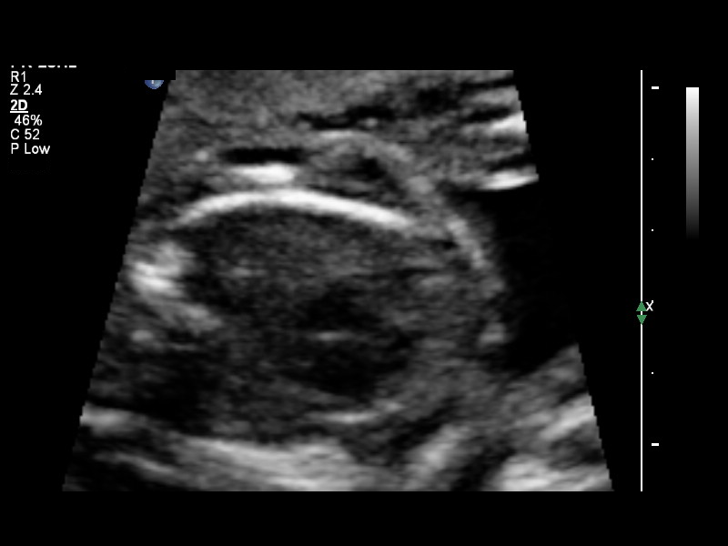

[12 of 28 positions shown; findings below may reference images not displayed]

OBSTETRICS REPORT
                      (Signed Final 08/13/2012 [DATE])

             DANIELE

Service(s) Provided

 US OB FOLLOW UP                                       76816.1
Indications

 Follow-up incomplete fetal anatomic evaluation
Fetal Evaluation

 Num Of Fetuses:    1
 Fetal Heart Rate:  135                         bpm
 Cardiac Activity:  Observed
 Presentation:      Cephalic
 Placenta:          Anterior, above cervical os

 Amniotic Fluid
 AFI FV:      Subjectively within normal limits
                                             Larg Pckt:     4.3  cm
Biometry

 BPD:     53.4  mm    G. Age:   22w 2d                CI:        70.42   70 - 86
                                                      FL/HC:      20.3   18.4 -

 HC:     202.9  mm    G. Age:   22w 3d       37  %    HC/AC:      1.12   1.06 -

 AC:     181.1  mm    G. Age:   22w 6d       59  %    FL/BPD:     77.0   71 - 87
 FL:      41.1  mm    G. Age:   23w 2d       69  %    FL/AC:      22.7   20 - 24
 HUM:     36.7  mm    G. Age:   22w 6d       55  %
 CER:       23  mm    G. Age:   21w 4d       29  %

 Est. FW:     555  gm      1 lb 4 oz     59  %
Gestational Age

 LMP:           19w 0d       Date:   04/02/12                 EDD:   01/07/13
 U/S Today:     22w 5d                                        EDD:   12/12/12
 Best:          22w 3d    Det. By:   U/S (06/30/12)           EDD:   12/14/12
Anatomy

 Cranium:          Appears normal         Aortic Arch:      Appears normal
 Fetal Cavum:      Previously seen        Ductal Arch:      Appears normal
 Ventricles:       Appears normal         Diaphragm:        Appears normal
 Choroid Plexus:   Appears normal         Stomach:          Appears normal
 Cerebellum:       Appears normal         Abdomen:          Appears normal
 Posterior Fossa:  Appears normal         Abdominal Wall:   Appears nml (cord
                                                            insert, abd wall)
 Nuchal Fold:      Previously seen        Cord Vessels:     Appears normal (3
                                                            vessel cord)
 Face:             Orbits appear          Kidneys:          Appear normal
                   normal
 Lips:             Appears normal         Bladder:          Appears normal
 Heart:            Appears normal         Spine:            Previously seen
                   (4CH, axis, and
                   situs)
 RVOT:             Appears normal         Lower             Previously seen
                                          Extremities:
 LVOT:             Appears normal         Upper             Previously seen
                                          Extremities:

 Other:  Fetus appears to be a male. Heels and 5th digit previously seen.
Targeted Anatomy

 Fetal Central Nervous System
 Cisterna Magna:
Cervix Uterus Adnexa

 Cervical Length:   3.4       cm

 Cervix:       Normal appearance by transabdominal scan.
 Left Ovary:   Simple cyst measuring 5.9x5x5.0 cm
 Right Ovary:  Within normal limits.
Impression

 Single intrauterine gestation demonstrating an estimated
 gestational age by ultrasound of 22w 5d. This is correlated
 with expected estimated gestational age by prior ultrasound
 of 22w 3d. EFW is currently at the 59%.

 An improved assessment of the 4 chamber heart was
 possible today but the fetal profile is not evaluated due to
 fetal position. Visualized anatomy today appears normal.

 No focal placental abnormality is seen.

 Subjectively and quantitatively normal amniotic fluid volume.

 Normal cervical length and appearance.

 ALMEIDA NUNES with us.  Please do not hesitate to

## 2016-06-18 ENCOUNTER — Encounter (INDEPENDENT_AMBULATORY_CARE_PROVIDER_SITE_OTHER): Payer: Self-pay | Admitting: Physician Assistant

## 2016-06-18 ENCOUNTER — Ambulatory Visit (INDEPENDENT_AMBULATORY_CARE_PROVIDER_SITE_OTHER): Payer: Self-pay | Admitting: Physician Assistant

## 2016-06-18 VITALS — BP 135/90 | HR 86 | Temp 98.8°F | Ht 61.5 in | Wt 151.0 lb

## 2016-06-18 DIAGNOSIS — R062 Wheezing: Secondary | ICD-10-CM

## 2016-06-18 DIAGNOSIS — J301 Allergic rhinitis due to pollen: Secondary | ICD-10-CM

## 2016-06-18 MED ORDER — ALBUTEROL SULFATE HFA 108 (90 BASE) MCG/ACT IN AERS: 2.0000 | INHALATION_SPRAY | RESPIRATORY_TRACT | 5 refills | Status: AC | PRN

## 2016-06-18 MED ORDER — LEVOCETIRIZINE DIHYDROCHLORIDE 5 MG PO TABS: 5.0000 mg | ORAL_TABLET | Freq: Every evening | ORAL | 0 refills | Status: AC

## 2016-06-18 MED ORDER — METHYLPREDNISOLONE ACETATE 40 MG/ML IJ SUSP
40.0000 mg | Freq: Once | INTRAMUSCULAR | Status: AC
  Administered 2016-06-18: 40 mg via INTRAMUSCULAR

## 2016-06-18 NOTE — Progress Notes (Signed)
Subjective:  [name], female    DOB: 03/08/1980  Age: 36 y.o. MRN: [id number]  CC: allergies  HPI Brenda Snow is a 36 y.o. female with a PMH of seasonal allergies presents for the same.  Pt has symptoms every year in the spring. Has bilateral conjunctivitis, nasal congestion, mild SOB, chest tightness, chills, mild swelling of the hands and feet, mild wheezing, and sneezing.  Has been taking cetirizine without relief. Usually receives a steroid injection every year for her symptoms. Denies fever, nausea, vomiting, rash, abdominal pain, radiating chest pain, recent long travel, hospitalization, or prolonged bed rest.     Outpatient Medications Prior to Visit  Medication Sig Dispense Refill  . cetirizine (ZYRTEC) 10 MG tablet Take 1 tablet (10 mg total) by mouth daily. (Patient not taking: Reported on 06/18/2016) 30 tablet 11  . MONONESSA 0.25-35 MG-MCG tablet TAKE 1 TABLET BY MOUTH DAILY (Patient not taking: Reported on 06/18/2016) 28 tablet 0  . Prenatal Multivit-Min-Fe-FA (PRENATAL VITAMINS) 0.8 MG tablet Take 1 tablet by mouth daily. (Patient not taking: Reported on 06/18/2016) 30 tablet 7   No facility-administered medications prior to visit.      ROS Review of Systems  Constitutional: Negative for chills, fever and malaise/fatigue.  HENT: Positive for sinus pain.   Eyes: Negative for blurred vision.  Respiratory: Positive for shortness of breath and wheezing. Negative for cough.   Cardiovascular: Negative for chest pain and palpitations.  Gastrointestinal: Negative for abdominal pain and nausea.  Genitourinary: Negative for dysuria and hematuria.  Musculoskeletal: Negative for joint pain and myalgias.  Skin: Negative for rash.  Neurological: Negative for tingling and headaches.  Psychiatric/Behavioral: Negative for depression. The patient is not nervous/anxious.     Objective:  BP 135/90 (BP Location: Right Arm, Patient Position:  Sitting, Cuff Size: Normal)   Pulse 86   Temp 98.8 F (37.1 C) (Oral)   Ht 5' 1.5" (1.562 m)   Wt 151 lb (68.5 kg)   LMP 06/04/2016 (Approximate)   SpO2 96%   BMI 28.07 kg/m   BP/Weight 06/18/2016 10/03/2013 07/11/2013  Systolic BP 135 120 133  Diastolic BP 90 68 78  Wt. (Lbs) 151 150 152  BMI 28.07 27.43 27.79      Physical Exam  Constitutional: She is oriented to person, place, and time.  Well developed, well nourished, NAD, polite  HENT:  Head: Normocephalic and atraumatic.  Mouth/Throat: No oropharyngeal exudate.  Mild left maxillary sinus TTP  Eyes: No scleral icterus.  Neck: Normal range of motion. Neck supple.  Cardiovascular: Normal rate, regular rhythm and normal heart sounds.   Pulmonary/Chest: Effort normal. No respiratory distress. She has wheezes. She has no rales. She exhibits no tenderness.  Abdominal: Soft. Bowel sounds are normal. There is no tenderness.  Musculoskeletal: She exhibits no edema.  Lymphadenopathy:    She has no cervical adenopathy.  Neurological: She is alert and oriented to person, place, and time. No cranial nerve deficit. Coordination normal.  Skin: Skin is warm and dry. No rash noted. No erythema. No pallor.  Psychiatric: She has a normal mood and affect. Her behavior is normal. Thought content normal.  Vitals reviewed.    Assessment & Plan:   1. Seasonal allergic rhinitis due to pollen - Begin albuterol (PROVENTIL HFA;VENTOLIN HFA) 108 (90 Base) MCG/ACT inhaler; Inhale 2 puffs into the lungs every 4 (four) hours as needed for wheezing or shortness of breath.  Dispense: 1 Inhaler; Refill: 5 - Administer methylPREDNISolone acetate (DEPO-MEDROL) injection 40  mg; Inject 1 mL (40 mg total) into the muscle once. - Begin levocetirizine (XYZAL) 5 MG tablet; Take 1 tablet (5 mg total) by mouth every evening.  Dispense: 30 tablet; Refill: 0  2. Wheezing - Begin albuterol (PROVENTIL HFA;VENTOLIN HFA) 108 (90 Base) MCG/ACT inhaler; Inhale 2 puffs  into the lungs every 4 (four) hours as needed for wheezing or shortness of breath.  Dispense: 1 Inhaler; Refill: 5 - Administer methylPREDNISolone acetate (DEPO-MEDROL) injection 40 mg; Inject 1 mL (40 mg total) into the muscle once. - Begin levocetirizine (XYZAL) 5 MG tablet; Take 1 tablet (5 mg total) by mouth every evening.  Dispense: 30 tablet; Refill: 0    Follow-up: Return in about 4 weeks (around 07/16/2016) for full physical.   Loletta Specter PA

## 2016-06-18 NOTE — Patient Instructions (Signed)
Alergias (Allergies) La alergia ocurre cuando el cuerpo reacciona a una sustancia de un modo que no es normal. Una reaccin alrgica puede producirse despus de cualquiera de estas acciones:  Comer algo.  Inhalar algo.  Tocar algo. CULES SON LAS CLASES DE ALERGIAS? Se puede ser alrgico a lo siguiente:  Cosas que surgen solo durante ciertas temporadas, como moho y Powder Hornpolen.  Alimentos.  Medicamentos.  Insectos.  Caspa de los Vassaranimales. CULES SON LOS SNTOMAS DE LAS ALERGIAS?  Hinchazn. Puede aparecer en los labios, la cara, la lengua, la boca o la garganta.  Estornudos.  Tos.  Respiracin ruidosa (sibilancias).  Nariz tapada.  Hormigueo en la boca.  Una erupcin.  Picazn.  Zonas de piel hinchadas, rojas y que producen picazn (ronchas).  Lagrimeo.  Vmitos.  Deposiciones lquidas (diarrea).  Mareos.  Desmayo o sensacin de desvanecimiento.  Problemas para respirar o tragar.  Sensacin de opresin en el pecho.  Latidos cardacos acelerados. CMO SE DIAGNOSTICAN LAS ALERGIAS? Las alergias pueden diagnosticarse mediante lo siguiente:  Antecedentes mdicos y familiares.  Pruebas cutneas.  Anlisis de Forsythsangre.  Un registro de alimentos. Un registro de Ryland Groupalimentos incluye todos los alimentos, las bebidas y los sntomas diarios.  Los resultados de una dieta de eliminacin. Esta dieta implica asegurarse de no comer determinados alimentos y Express Scriptsluego ver qu sucede cuando comienza a comerlos de nuevo. CMO SE TRATAN LAS ALERGIAS? No hay una cura para las [place]alergias, pero las reacciones alrgicas pueden tratarse con medicamentos. Generalmente, las reacciones graves deben tratarse en un hospital. CMO PUEDEN PREVENIRSE LAS REACCIONES? La mejor manera de prevenir una reaccin alrgica es evitar el elemento que le causa Programmer, multimediaalergia. Las vacunas y los medicamentos para la alergia tambin pueden ayudar a prevenir las reacciones en Energy Transfer Partnersalgunos casos. Esta informacin no  tiene Theme park managercomo fin reemplazar el consejo del mdico. Asegrese de hacerle al mdico cualquier pregunta que tenga. Document Released: 09/22/2012 Document Revised: 02/10/2014 Document Reviewed: 11/01/2013 Elsevier Interactive Patient Education  2017 [employer]

## 2023-06-08 ENCOUNTER — Other Ambulatory Visit: Payer: Self-pay | Admitting: Family Medicine

## 2023-06-08 DIAGNOSIS — N939 Abnormal uterine and vaginal bleeding, unspecified: Secondary | ICD-10-CM

## 2023-06-11 ENCOUNTER — Ambulatory Visit
Admission: RE | Admit: 2023-06-11 | Discharge: 2023-06-11 | Disposition: A | Discharge status: Home / Self Care | Payer: Self-pay | Source: Ambulatory Visit | Attending: Family Medicine | Admitting: Family Medicine

## 2023-06-11 DIAGNOSIS — N939 Abnormal uterine and vaginal bleeding, unspecified: Secondary | ICD-10-CM
